# Patient Record
Sex: Female | Born: 1995 | Race: White | Hispanic: No | Marital: Single | Smoking: Never smoker
Health system: Southern US, Community
[De-identification: ages and names within clinical notes are randomized; demographics above are authoritative.]

## PROBLEM LIST (undated history)

## (undated) DIAGNOSIS — R569 Unspecified convulsions: Secondary | ICD-10-CM

## (undated) DIAGNOSIS — F909 Attention-deficit hyperactivity disorder, unspecified type: Secondary | ICD-10-CM

## (undated) DIAGNOSIS — M222X9 Patellofemoral disorders, unspecified knee: Secondary | ICD-10-CM

## (undated) DIAGNOSIS — G2581 Restless legs syndrome: Secondary | ICD-10-CM

## (undated) HISTORY — DX: Patellofemoral disorders, unspecified knee: M22.2X9

## (undated) HISTORY — DX: Unspecified convulsions: R56.9

## (undated) HISTORY — PX: UPPER GI ENDOSCOPY: SHX6162

## (undated) HISTORY — DX: Restless legs syndrome: G25.81

## (undated) HISTORY — PX: WISDOM TOOTH EXTRACTION: SHX21

---
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
## 2018-06-10 ENCOUNTER — Ambulatory Visit (INDEPENDENT_AMBULATORY_CARE_PROVIDER_SITE_OTHER): Payer: Medicaid Other | Admitting: Women's Health

## 2018-06-10 ENCOUNTER — Encounter: Payer: Self-pay | Admitting: Women's Health

## 2018-06-10 VITALS — BP 124/83 | HR 84 | Ht 64.0 in | Wt 200.0 lb

## 2018-06-10 DIAGNOSIS — N930 Postcoital and contact bleeding: Secondary | ICD-10-CM

## 2018-06-10 DIAGNOSIS — Z8742 Personal history of other diseases of the female genital tract: Secondary | ICD-10-CM

## 2018-06-10 DIAGNOSIS — Z319 Encounter for procreative management, unspecified: Secondary | ICD-10-CM

## 2018-06-10 DIAGNOSIS — N912 Amenorrhea, unspecified: Secondary | ICD-10-CM

## 2018-06-10 DIAGNOSIS — Z3202 Encounter for pregnancy test, result negative: Secondary | ICD-10-CM | POA: Diagnosis not present

## 2018-06-10 LAB — POCT URINE PREGNANCY: Preg Test, Ur: NEGATIVE

## 2018-06-10 MED ORDER — MEDROXYPROGESTERONE ACETATE 10 MG PO TABS: 10.0000 mg | ORAL_TABLET | Freq: Every day | ORAL | 0 refills | Status: DC

## 2018-06-10 NOTE — Progress Notes (Signed)
GYN VISIT Patient name: Bianca Singleton MRN [id number]  Date of birth: Feb 05, 1996 Chief Complaint:   Referral (Amenorrhea/ spotting after inercourse)  History of Present Illness:   Bianca Singleton is a 23 y.o. G93 Caucasian female being seen today for report of spotting after sex x couple of weeks, and no period since Sept.   Menarche @ 10-11yo, regular until Sept, hasn't had period since. Prior to this periods lasted 7-9d, heavy entire time, changed ultra tampon q 4mins, small clots, +cramping. Trying to get pregnant since Sept. Multiple +HPTs, neg BHCG and normal TSH w/ PCP. Reports her mom never had +UPT or HCG w/ her pregnancies, only was she knew was w/ u/s.   Patient's last menstrual period was 02/05/2018. The current method of family planning is none. Last pap <21yo. Results were:  normal Review of Systems:   Pertinent items are noted in HPI Denies fever/chills, dizziness, headaches, visual disturbances, fatigue, shortness of breath, chest pain, abdominal pain, vomiting, abnormal vaginal discharge/itching/odor/irritation, problems with periods, bowel movements, urination, or intercourse unless otherwise stated above.  Pertinent History Reviewed:  Reviewed past medical,surgical, social, obstetrical and family history.  Reviewed problem list, medications and allergies. Physical Assessment:   Vitals:   06/10/18 1433  BP: 124/83  Pulse: 84  Weight: 200 lb (90.7 kg)  Height: 5\' 4"  (1.626 m)  Body mass index is 34.33 kg/m.       Physical Examination:   General appearance: alert, well appearing, and in no distress  Mental status: alert, oriented to person, place, and time  Skin: warm & dry   Cardiovascular: normal heart rate noted  Respiratory: normal respiratory effort, no distress  Abdomen: soft, non-tender   Pelvic: VULVA: normal appearing vulva with no masses, tenderness or lesions, VAGINA: normal appearing vagina with normal color and discharge, small amt stringy bloody d/c, no  lesions, CERVIX: normal appearing cervix without discharge or lesions  Extremities: no edema   Informal transabdominal u/s: bladder visualized, then another anechoic large circular area noted- unable to tell if GS or ovarian cyst Informal transvaginal u/s: normal appearing empty uterus w/ what appears to be ovarian cyst  Results for orders placed or performed in visit on 06/10/18 (from the past 24 hour(s))  POCT urine pregnancy   Collection Time: 06/10/18  2:57 PM  Result Value Ref Range   Preg Test, Ur Negative Negative    Assessment & Plan:  1) Secondary amenorrhea> no period since Sept, will get BHCG today, if neg, start provera x10d to see if has w/drawal bleed, if so wishes to check progesterone on day 21 to determine if ovulating  2) H/o menorrhagia  3) Ovarian cyst> will bring back for formal u/s  4) Desire for pregnancy>continue pnv  5) Postcoital spotting> nuswab plus sent  Meds:  Meds ordered this encounter  Medications  . medroxyPROGESTERone (PROVERA) 10 MG tablet    Sig: Take 1 tablet (10 mg total) by mouth daily.    Dispense:  10 tablet    Refill:  0    Order Specific Question:   Supervising Provider    Answer:   Tania Ade H [[id number]]    Orders Placed This Encounter  Procedures  . US PELVIS (TRANSABDOMINAL ONLY)  . US PELVIS TRANSVANGINAL NON-OB (TV ONLY)  . Beta hCG quant (ref lab)  . NuSwab Vaginitis Plus (VG+)  . POCT urine pregnancy    Return in about 2 weeks (around 06/24/2018) for US:GYN and f/u w/ me after.  Roma Schanz  CNM, WHNP-BC 06/10/2018 5:07 PM

## 2018-06-10 NOTE — Patient Instructions (Signed)
Continue prenatal vitamin daily

## 2018-06-11 ENCOUNTER — Encounter: Payer: Self-pay | Admitting: Women's Health

## 2018-06-11 LAB — BETA HCG QUANT (REF LAB): hCG Quant: <1 m[IU]/mL

## 2018-06-14 LAB — NUSWAB VAGINITIS PLUS (VG+)
Candida albicans, NAA: NEGATIVE
Candida glabrata, NAA: NEGATIVE
Chlamydia trachomatis, NAA: NEGATIVE
NEISSERIA GONORRHOEAE, NAA: NEGATIVE
TRICH VAG BY NAA: POSITIVE — AB

## 2018-06-15 ENCOUNTER — Encounter: Payer: Self-pay | Admitting: Women's Health

## 2018-06-15 ENCOUNTER — Other Ambulatory Visit: Payer: Self-pay | Admitting: Women's Health

## 2018-06-15 DIAGNOSIS — A599 Trichomoniasis, unspecified: Secondary | ICD-10-CM | POA: Insufficient documentation

## 2018-06-15 MED ORDER — METRONIDAZOLE 500 MG PO TABS: 500.0000 mg | ORAL_TABLET | Freq: Two times a day (BID) | ORAL | 0 refills | Status: DC

## 2018-06-24 ENCOUNTER — Other Ambulatory Visit: Payer: Self-pay

## 2018-06-24 ENCOUNTER — Ambulatory Visit (INDEPENDENT_AMBULATORY_CARE_PROVIDER_SITE_OTHER): Payer: Medicaid Other

## 2018-06-24 ENCOUNTER — Ambulatory Visit (INDEPENDENT_AMBULATORY_CARE_PROVIDER_SITE_OTHER): Payer: Medicaid Other | Admitting: Women's Health

## 2018-06-24 ENCOUNTER — Encounter: Payer: Self-pay | Admitting: Women's Health

## 2018-06-24 ENCOUNTER — Other Ambulatory Visit: Payer: Self-pay | Admitting: Women's Health

## 2018-06-24 VITALS — BP 110/50 | HR 69 | Ht 64.0 in | Wt 205.0 lb

## 2018-06-24 DIAGNOSIS — N912 Amenorrhea, unspecified: Secondary | ICD-10-CM

## 2018-06-24 DIAGNOSIS — N83202 Unspecified ovarian cyst, left side: Secondary | ICD-10-CM | POA: Diagnosis not present

## 2018-06-24 DIAGNOSIS — Z8742 Personal history of other diseases of the female genital tract: Secondary | ICD-10-CM | POA: Diagnosis not present

## 2018-06-24 DIAGNOSIS — N83201 Unspecified ovarian cyst, right side: Secondary | ICD-10-CM | POA: Diagnosis not present

## 2018-06-24 DIAGNOSIS — N930 Postcoital and contact bleeding: Secondary | ICD-10-CM

## 2018-06-24 MED ORDER — NORGESTIMATE-ETH ESTRADIOL 0.25-35 MG-MCG PO TABS: 1.0000 | ORAL_TABLET | Freq: Every day | ORAL | 0 refills | Status: DC

## 2018-06-24 NOTE — Progress Notes (Signed)
GYN VISIT Patient name: Bianca Singleton MRN [id number]  Date of birth: 05/19/1996 Chief Complaint:   Follow-up (gyn u/s)  History of Present Illness:   Bianca Singleton is a 23 y.o. G84 Caucasian female being seen today for f/u after pelvic u/s done for ovarian cyst/abdominal pain. Having constant RLQ sharp crampy pain. Treated 1/27 for trichomonas, her & partner took all meds, no sex since. Secondary amenorrhea- last period 9/19. She was given 10d provera challenge 1/22-1/31, has not started bleeding. Just 'wants pain gone'.     Does not smoke, no Singleton/o HTN (no recent hx), DVT/PE, CVA, MI, or migraines w/ aura.  No LMP recorded. The current method of family planning is none. Last pap <21yo. Results were:  n/a Review of Systems:   Pertinent items are noted in HPI Denies fever/chills, dizziness, headaches, visual disturbances, fatigue, shortness of breath, chest pain, abdominal pain, vomiting, abnormal vaginal discharge/itching/odor/irritation, problems with periods, bowel movements, urination, or intercourse unless otherwise stated above.  Pertinent History Reviewed:  Reviewed past medical,surgical, social, obstetrical and family history.  Reviewed problem list, medications and allergies. Physical Assessment:   Vitals:   06/24/18 1514  BP: (!) 110/50  Pulse: 69  Weight: 205 lb (93 kg)  Height: 5\' 4"  (1.626 m)  Body mass index is 35.19 kg/m.       Physical Examination:   General appearance: alert, well appearing, and in no distress  Mental status: alert, oriented to person, place, and time  Skin: warm & dry   Cardiovascular: normal heart rate noted  Respiratory: normal respiratory effort, no distress  Abdomen: soft, non-tender   Pelvic: examination not indicated  Extremities: no edema    Today's Pelvic U/S:  Kanyon Bunn is a 23 y.o. LMP 02/05/2018,she is here for a pelvic sonogram for pelvic pain,amenorrhea.  Uterus                      7.4 x 4 x 4.8 cm, Total uterine volume 75  cc,homogeneous anteverted uterus,wnl  Endometrium          5.9 mm, symmetrical, wnl  Right ovary             4.7 x 6.7 x 4.8 cm, two simple right ovarian cysts (#1) 4.5 x 3.4 x 4.1 cm,(#2) 4.3 x 4.2 x 2.6 cm  Left ovary                5.7 x 4.7 x 3 cm, two simple left ovarian cysts (#1) 2.9 x 2 x 3.4 cm,(#2) 2.6 x 2.1 x 2.2 cm  No free fluid   Technician Comments:  PELVIC US TA/TV:homogeneous anteverted uterus,wnl,EEC 5.9 mm,bilat simple ovarian cysts w/arterial and venous flow,two simple left ovarian cysts (#1) 2.9 x 2 x 3.4 cm,(#2) 2.6 x 2.1 x 2.2 cm,two simple right ovarian cysts (#1) 4.5 x 3.4 x 4.1 cm,(#2) 4.3 x 4.2 x 2.6 cm,no free fluid,bilat adnexal pain,ovaries appear mobile  [employer] 06/24/2018 3:21 PM  No results found for this or any previous visit (from the past 24 hour(s)).  Assessment & Plan:  1) Bilateral ovarian cysts w/ RLQ pain and secondary amenorrhea> 2 on q side, no bleeding so far 5d s/p provera challenge, reviewed w/ Bianca Singleton, recommends Sprintec x 49mth, then f/u  2) Recent trichomonas> too early for POC today, will do next visit  Meds:  Meds ordered this encounter  Medications  . norgestimate-ethinyl estradiol (ORTHO-CYCLEN,SPRINTEC,PREVIFEM) 0.25-35 MG-MCG tablet    Sig: Take 1  tablet by mouth daily.    Dispense:  1 Package    Refill:  0    Order Specific Question:   Supervising Provider    Answer:   Elonda Singleton, Bianca Singleton [[id number]]    No orders of the defined types were placed in this encounter.   Return in about 1 month (around 07/23/2018) for F/U w/ MD.  Bianca Singleton CNM, WHNP-BC 06/24/2018 3:50 PM

## 2018-06-24 NOTE — Progress Notes (Signed)
PELVIC US TA/TV:homogeneous anteverted uterus,wnl,EEC 5.9 mm,bilat simple ovarian cysts w/arterial and venous flow,two simple left ovarian cysts (#1) 2.9 x 2 x 3.4 cm,(#2) 2.6 x 2.1 x 2.2 cm,two simple right ovarian cysts (#1) 4.5 x 3.4 x 4.1 cm,(#2) 4.3 x 4.2 x 2.6 cm,no free fluid,bilat adnexal pain,ovaries appear mobile

## 2018-06-26 ENCOUNTER — Other Ambulatory Visit: Payer: Self-pay | Admitting: Adult Health

## 2018-06-26 MED ORDER — PROMETHAZINE HCL 25 MG PO TABS: 25.0000 mg | ORAL_TABLET | Freq: Four times a day (QID) | ORAL | 1 refills | Status: DC | PRN

## 2018-06-26 NOTE — Progress Notes (Signed)
rx phenergan 

## 2018-06-30 NOTE — Telephone Encounter (Signed)
Return call to patient to find out about her current pain levels.  She is taken some ibuprofen and describes this is "not helping.  She sounds pretty calm and relaxed.  She is also tried tramadol and describes it is "not helping".  I am not sure what her expectations are in terms of pain relief. Patient advised to try to take that tramadol she received in the emergency room and the ibuprofen in combination to see if that helps and to try to give the oral contraceptive regimen some more time to suppress the ovaries.  If this does not work she is advised to call back and will make an appointment to see what the next treatment is required.,  Whether that be laparoscopy or other treatments

## 2018-07-01 ENCOUNTER — Ambulatory Visit (INDEPENDENT_AMBULATORY_CARE_PROVIDER_SITE_OTHER): Payer: Medicaid Other | Admitting: Obstetrics and Gynecology

## 2018-07-01 ENCOUNTER — Encounter: Payer: Self-pay | Admitting: Obstetrics and Gynecology

## 2018-07-01 VITALS — BP 114/71 | HR 82 | Ht 64.0 in | Wt 207.0 lb

## 2018-07-01 DIAGNOSIS — R1084 Generalized abdominal pain: Secondary | ICD-10-CM

## 2018-07-01 DIAGNOSIS — Z8619 Personal history of other infectious and parasitic diseases: Secondary | ICD-10-CM | POA: Diagnosis not present

## 2018-07-01 DIAGNOSIS — Z09 Encounter for follow-up examination after completed treatment for conditions other than malignant neoplasm: Secondary | ICD-10-CM

## 2018-07-01 NOTE — Progress Notes (Signed)
Patient ID: Bianca Singleton, female   DOB: December 28, 1995, 23 y.o.   MRN: [id number]    [facility] Visit  @DATE @            Patient name: Bianca Singleton MRN [id number]  Date of birth: 1996-01-08  CC & HPI:  [name] is a 23 y.o. female presenting today for" bilateral ovarian cyst pain" Says that pain is 7 or 8 mainly on right side of navel. Has had upper endoscopy but no surgeries.  Pain has been going on since November 2019. Pain comes and goes every 5-10 minutes, has not found anything that makes anything better. Has more pain for about 10-15 minutes after bowel movements. Has bowel movement twice a day with shape., formed stools. Has NOT had bm today.  Pulse is 60 at time of perceived pain rated a 7., with no guarding or rebound. Pt has nonverbal boyfriend with her, and told RN before eval not to mention the Trich infection to boyfriend cause he don't want to hear of it., and so POC done without discussion. ROS:  ROS +abdominal pain +bilateral ovarian cysts -fever -chills  All systems are negative except as noted in the HPI and PMH.    Pertinent History Reviewed:   Reviewed: Medical         Past Medical History:  Diagnosis Date  . ADHD (attention deficit hyperactivity disorder)   . Patella-femoral syndrome   . Restless legs                               Surgical Hx:    Past Surgical History:  Procedure Laterality Date  . UPPER GI ENDOSCOPY    . WISDOM TOOTH EXTRACTION     Medications: Reviewed & Updated - see associated section                       Current Outpatient Medications:  .  meloxicam (MOBIC) 15 MG tablet, TK 1 T PO QD, Disp: , Rfl:  .  norgestimate-ethinyl estradiol (ORTHO-CYCLEN,SPRINTEC,PREVIFEM) 0.25-35 MG-MCG tablet, Take 1 tablet by mouth daily., Disp: 1 Package, Rfl: 0 .  pramipexole (MIRAPEX) 0.5 MG tablet, Take 0.5 mg by mouth 3 (three) times daily., Disp: , Rfl:  .  promethazine (PHENERGAN) 25 MG tablet, Take 1 tablet (25 mg total) by mouth every  6 (six) hours as needed for nausea or vomiting., Disp: 30 tablet, Rfl: 1 .  traZODone (DESYREL) 50 MG tablet, TAKE 1-2 TABLETS BY MOUTH EVERY DAY AT BEDTIME AS NEEDED FOR SLEEP, Disp: , Rfl:  .  medroxyPROGESTERone (PROVERA) 10 MG tablet, Take 1 tablet (10 mg total) by mouth daily. (Patient not taking: Reported on 07/01/2018), Disp: 10 tablet, Rfl: 0   Social History: Reviewed -  reports that she has never smoked. She has never used smokeless tobacco.  Objective Findings:  Vitals: Blood pressure 114/71, pulse 82, height 5\' 4"  (1.626 m), weight 207 lb (93.9 kg).  PHYSICAL EXAMINATION General appearance - alert, well appearing, and in no distress Mental status - alert, oriented to person, place, and time, normal mood, behavior, speech, dress, motor activity, and thought processes, affect appropriate to mood Abdomen- soft, hurts when pressing inwards on stomach, no guarding or rebound  PELVIC Bowel: hard, well formed bowel Vagina - generous amount white creamy normal in appereance Cervix - healthy secretions Uterus - normal Wet Mount - nml epithelial, neg trich  Assessment & Plan:  A:  1. Functional GI pain 2. overweight 3. Trichomonas POC negative 4. Ovarian cysts by history, NOT the source of pt current concerns  P:  1. Miralax dalily 2. F/u in 2 weeks for re-eval.    By signing my name below, I, [name], attest that this documentation has been prepared under the direction and in the presence of Jonnie Kind, MD. Electronically Signed: [place]. 07/01/18. 2:45 PM.  I personally performed the services described in this documentation, which was SCRIBED in my presence. The recorded information has been reviewed and considered accurate. It has been edited as necessary during review. Jonnie Kind, MD

## 2018-07-21 ENCOUNTER — Other Ambulatory Visit: Payer: Self-pay | Admitting: *Deleted

## 2018-07-21 MED ORDER — NORGESTIMATE-ETH ESTRADIOL 0.25-35 MG-MCG PO TABS: 1.0000 | ORAL_TABLET | Freq: Every day | ORAL | 11 refills | Status: AC

## 2018-07-22 ENCOUNTER — Ambulatory Visit: Payer: Self-pay | Admitting: Obstetrics and Gynecology

## 2018-07-28 ENCOUNTER — Telehealth: Payer: Self-pay | Admitting: Obstetrics and Gynecology

## 2018-07-28 NOTE — Telephone Encounter (Signed)
Phone call to Lamar this afternoon at 1:30 PM: Jordon reports that she attempted stool softeners after last visit "they did not help even though they did make her bowel movements softer.  She is not currently taking stool softener if she is advised to resume taking it Patrica reports that the pain is worse when she is up and about but it is intermittent pain inside a continuous steady pain.  I have reviewed the ultrasound from February and she had 3 simple cyst, 2 of them on the right with no swelling in the ovaries and no suspicion of torsion. Morgan remains on her birth control pill Sprintec which is an intermediate dose estrogen containing pill which should be able to suppress the ovaries. 4.  Plan patient is asked to keep a pain calendar, continue the stool softener and birth control pills and plan to follow-up as scheduled on 23rd August 07, 2018.  Patient aware that she can try to move her appointment up if the pain worsens

## 2018-08-10 ENCOUNTER — Telehealth: Payer: Self-pay | Admitting: Obstetrics and Gynecology

## 2018-08-10 ENCOUNTER — Other Ambulatory Visit: Payer: Self-pay

## 2018-08-10 ENCOUNTER — Encounter: Payer: Self-pay | Admitting: Obstetrics and Gynecology

## 2018-08-10 ENCOUNTER — Ambulatory Visit (INDEPENDENT_AMBULATORY_CARE_PROVIDER_SITE_OTHER): Payer: Medicaid Other | Admitting: Obstetrics and Gynecology

## 2018-08-10 VITALS — BP 100/70 | HR 69 | Ht 64.0 in | Wt 210.0 lb

## 2018-08-10 DIAGNOSIS — R1084 Generalized abdominal pain: Secondary | ICD-10-CM | POA: Diagnosis not present

## 2018-08-10 DIAGNOSIS — N760 Acute vaginitis: Secondary | ICD-10-CM

## 2018-08-10 DIAGNOSIS — B9689 Other specified bacterial agents as the cause of diseases classified elsewhere: Secondary | ICD-10-CM

## 2018-08-10 LAB — POCT WET PREP (WET MOUNT): Trichomonas Wet Prep HPF POC: ABSENT

## 2018-08-10 MED ORDER — METRONIDAZOLE 500 MG PO TABS: 500.0000 mg | ORAL_TABLET | Freq: Two times a day (BID) | ORAL | 0 refills | Status: AC

## 2018-08-10 NOTE — Progress Notes (Signed)
Patient ID: Bianca Singleton, female   DOB: 1995/11/29, 23 y.o.   MRN: [id number]   [facility] Visit  @DATE @            Patient name: Bianca Singleton MRN [id number]  Date of birth: 04-02-1996  CC & HPI:  Bianca Singleton is a 23 y.o. female presenting today for POC trich, f/u amenorrhea & cysts. Still having right sided LQ pain towards her hip. She notices the pain whenever she is up walking around. Is still taking stool softener with regular BM 3-4 times a day. Says bf has been treated for trich infection. Denies noticing vaginal discharge. Partner was treated. LMP 07/30/2018  ROS:  ROS +RLQ pain -fever  All systems are negative except as noted in the HPI and PMH.   Pertinent History Reviewed:   Reviewed:  Medical         Past Medical History:  Diagnosis Date  . ADHD (attention deficit hyperactivity disorder)   . Patella-femoral syndrome   . Restless legs                               Surgical Hx:    Past Surgical History:  Procedure Laterality Date  . UPPER GI ENDOSCOPY    . WISDOM TOOTH EXTRACTION     Medications: Reviewed & Updated - see associated section                       Current Outpatient Medications:  .  medroxyPROGESTERone (PROVERA) 10 MG tablet, Take 1 tablet (10 mg total) by mouth daily. (Patient not taking: Reported on 07/01/2018), Disp: 10 tablet, Rfl: 0 .  meloxicam (MOBIC) 15 MG tablet, TK 1 T PO QD, Disp: , Rfl:  .  norgestimate-ethinyl estradiol (ORTHO-CYCLEN,SPRINTEC,PREVIFEM) 0.25-35 MG-MCG tablet, Take 1 tablet by mouth daily., Disp: 1 Package, Rfl: 11 .  pramipexole (MIRAPEX) 0.5 MG tablet, Take 0.5 mg by mouth 3 (three) times daily., Disp: , Rfl:  .  promethazine (PHENERGAN) 25 MG tablet, Take 1 tablet (25 mg total) by mouth every 6 (six) hours as needed for nausea or vomiting., Disp: 30 tablet, Rfl: 1 .  traZODone (DESYREL) 50 MG tablet, TAKE 1-2 TABLETS BY MOUTH EVERY DAY AT BEDTIME AS NEEDED FOR SLEEP, Disp: , Rfl:    Social History: Reviewed -   reports that she has never smoked. She has never used smokeless tobacco.  Objective Findings:  Vitals: There were no vitals taken for this visit.  PHYSICAL EXAMINATION General appearance - alert, well appearing, and in no distress Mental status - normal mood, behavior, speech, dress, motor activity, and thought processes, affect appropriate to mood  PELVIC Vagina - normal Cervix - appears normal Uterus - normal Wet Mount - POC trich, pos clue   Assessment & Plan:   A:  1. POC Trich 2. BV  P:  1.  Repeat TV u/s in 3 weeks with f/u by phone 2. Metrogel BID x7 days     By signing my name below, I, [name], attest that this documentation has been prepared under the direction and in the presence of Jonnie Kind, MD. Electronically Signed: [place]. 08/10/18. 3:34 PM.  I personally performed the services described in this documentation, which was SCRIBED in my presence. The recorded information has been reviewed and considered accurate. It has been edited as necessary during review. Jonnie Kind, MD

## 2018-08-10 NOTE — Addendum Note (Signed)
Addended by: Linton Rump on: 08/10/2018 05:10 PM   Modules accepted: Orders

## 2018-08-10 NOTE — Telephone Encounter (Signed)
Telephone call to attempt to Telehealth today's appt. Pt unavailable.

## 2018-08-15 ENCOUNTER — Encounter: Payer: Self-pay | Admitting: Emergency Medicine

## 2018-08-15 ENCOUNTER — Emergency Department
Admission: EM | Admit: 2018-08-15 | Discharge: 2018-08-15 | Disposition: A | Discharge status: Home / Self Care | Payer: Medicaid Other | Attending: Student in an Organized Health Care Education/Training Program | Admitting: Student in an Organized Health Care Education/Training Program

## 2018-08-15 ENCOUNTER — Emergency Department: Payer: Medicaid Other

## 2018-08-15 ENCOUNTER — Other Ambulatory Visit: Payer: Self-pay

## 2018-08-15 DIAGNOSIS — Z79899 Other long term (current) drug therapy: Secondary | ICD-10-CM | POA: Insufficient documentation

## 2018-08-15 DIAGNOSIS — R197 Diarrhea, unspecified: Secondary | ICD-10-CM | POA: Diagnosis not present

## 2018-08-15 DIAGNOSIS — R112 Nausea with vomiting, unspecified: Secondary | ICD-10-CM | POA: Insufficient documentation

## 2018-08-15 DIAGNOSIS — R1011 Right upper quadrant pain: Secondary | ICD-10-CM | POA: Diagnosis present

## 2018-08-15 DIAGNOSIS — F909 Attention-deficit hyperactivity disorder, unspecified type: Secondary | ICD-10-CM | POA: Insufficient documentation

## 2018-08-15 DIAGNOSIS — R1013 Epigastric pain: Secondary | ICD-10-CM | POA: Insufficient documentation

## 2018-08-15 LAB — URINALYSIS, COMPLETE (UACMP) WITH MICROSCOPIC
BILIRUBIN URINE: NEGATIVE
Glucose, UA: NEGATIVE mg/dL
KETONES UR: NEGATIVE mg/dL
Nitrite: NEGATIVE
PH: 5 (ref 5.0–8.0)
Protein, ur: NEGATIVE mg/dL
Specific Gravity, Urine: 1.019 (ref 1.005–1.030)

## 2018-08-15 LAB — POCT PREGNANCY, URINE: Preg Test, Ur: NEGATIVE

## 2018-08-15 LAB — COMPREHENSIVE METABOLIC PANEL
ALT: 16 U/L (ref 0–44)
ANION GAP: 7 (ref 5–15)
AST: 17 U/L (ref 15–41)
Albumin: 4.2 g/dL (ref 3.5–5.0)
Alkaline Phosphatase: 47 U/L (ref 38–126)
BUN: 20 mg/dL (ref 6–20)
CHLORIDE: 106 mmol/L (ref 98–111)
CO2: 26 mmol/L (ref 22–32)
Calcium: 8.9 mg/dL (ref 8.9–10.3)
Creatinine, Ser: 0.78 mg/dL (ref 0.44–1.00)
GFR calc Af Amer: >60 mL/min (ref >60)
GFR calc non Af Amer: >60 mL/min (ref >60)
Glucose, Bld: 109 mg/dL — ABNORMAL HIGH (ref 70–99)
POTASSIUM: 3.4 mmol/L — AB (ref 3.5–5.1)
SODIUM: 139 mmol/L (ref 135–145)
TOTAL PROTEIN: 7.2 g/dL (ref 6.5–8.1)
Total Bilirubin: 0.7 mg/dL (ref 0.3–1.2)

## 2018-08-15 LAB — CBC WITH DIFFERENTIAL/PLATELET
Abs Immature Granulocytes: 0.05 10*3/uL (ref 0.00–0.07)
BASOS ABS: 0.1 10*3/uL (ref 0.0–0.1)
Basophils Relative: 1 %
EOS ABS: 0.3 10*3/uL (ref 0.0–0.5)
EOS PCT: 2 %
HCT: 39 % (ref 36.0–46.0)
Hemoglobin: 12.9 g/dL (ref 12.0–15.0)
Immature Granulocytes: 1 %
Lymphocytes Relative: 37 %
Lymphs Abs: 4 10*3/uL (ref 0.7–4.0)
MCH: 29.2 pg (ref 26.0–34.0)
MCHC: 33.1 g/dL (ref 30.0–36.0)
MCV: 88.2 fL (ref 80.0–100.0)
MONO ABS: 0.5 10*3/uL (ref 0.1–1.0)
Monocytes Relative: 5 %
NEUTROS ABS: 5.9 10*3/uL (ref 1.7–7.7)
Neutrophils Relative %: 54 %
Platelets: 264 10*3/uL (ref 150–400)
RBC: 4.42 MIL/uL (ref 3.87–5.11)
RDW: 13.3 % (ref 11.5–15.5)
WBC: 10.8 10*3/uL — AB (ref 4.0–10.5)
nRBC: 0 % (ref 0.0–0.2)

## 2018-08-15 LAB — LIPASE, BLOOD: Lipase: 21 U/L (ref 11–51)

## 2018-08-15 MED ORDER — SODIUM CHLORIDE 0.9 % IV BOLUS
1000.0000 mL | Freq: Once | INTRAVENOUS | Status: AC
  Administered 2018-08-15: 1000 mL via INTRAVENOUS

## 2018-08-15 MED ORDER — ONDANSETRON HCL 4 MG/2ML IJ SOLN
4.0000 mg | Freq: Once | INTRAMUSCULAR | Status: AC
  Administered 2018-08-15: 4 mg via INTRAVENOUS
  Filled 2018-08-15: qty 2

## 2018-08-15 MED ORDER — PROMETHAZINE HCL 25 MG PO TABS: 25.0000 mg | ORAL_TABLET | Freq: Four times a day (QID) | ORAL | 0 refills | Status: DC | PRN

## 2018-08-15 NOTE — Discharge Instructions (Signed)

## 2018-08-15 NOTE — ED Triage Notes (Addendum)
Pt was seen here earlier this am for abd pain and diarrhea; pt says when she was discharged she was told to return if the symptoms returned; pt says she's having diarrhea again but none of her symptoms are worse than when she was here earlier; pt in no acute distress

## 2018-08-15 NOTE — ED Notes (Signed)
Pt reports no nausea at this time

## 2018-08-15 NOTE — ED Provider Notes (Signed)
[facility] Emergency Department Provider Note    First MD Initiated Contact with Patient 08/15/18 [phone]     (approximate)  I have reviewed the triage vital signs and the nursing notes.   HISTORY  Chief Complaint Abdominal Pain; Fever; and Shortness of Breath    HPI Bianca Singleton is a 23 y.o. female below listed past medical history presents the ER for evaluation of generalized abdominal pain fever to 102 yesterday with crampy abdominal pain diarrhea.  Is on erythromycin for strep throat.  Does have a history of ovarian cyst.  Denies any vaginal discharge or dysuria.  Denies any chance of being pregnant.  No known sick contacts.   Past Medical History:  Diagnosis Date  . ADHD (attention deficit hyperactivity disorder)   . Patella-femoral syndrome   . Restless legs    Family History  Problem Relation Age of Onset  . Lung cancer Paternal Grandfather   . Breast cancer Paternal Grandmother    Past Surgical History:  Procedure Laterality Date  . UPPER GI ENDOSCOPY    . WISDOM TOOTH EXTRACTION     Patient Active Problem List   Diagnosis Date Noted  . Intermittent generalized abdominal pain 07/01/2018  . Trichomonas infection 06/15/2018      Prior to Admission medications   Medication Sig Start Date End Date Taking? Authorizing Provider  meloxicam (MOBIC) 15 MG tablet TK 1 T PO QD 06/07/18   [provider]  metroNIDAZOLE (FLAGYL) 500 MG tablet Take 1 tablet (500 mg total) by mouth 2 (two) times daily for 7 days. 08/10/18 08/17/18  Jonnie Kind, MD  norgestimate-ethinyl estradiol (ORTHO-CYCLEN,SPRINTEC,PREVIFEM) 0.25-35 MG-MCG tablet Take 1 tablet by mouth daily. 07/21/18   Roma Schanz, CNM  pramipexole (MIRAPEX) 0.5 MG tablet Take 0.5 mg by mouth 3 (three) times daily.    [provider]  promethazine (PHENERGAN) 25 MG tablet Take 1 tablet (25 mg total) by mouth every 6 (six) hours as needed for nausea or vomiting. 08/15/18    Merlyn Lot, MD  traZODone (DESYREL) 50 MG tablet 25 mg at bedtime.  03/12/18   [provider]    Allergies Patient has no known allergies.    Social History Social History   Tobacco Use  . Smoking status: Never Smoker  . Smokeless tobacco: Never Used  Substance Use Topics  . Alcohol use: Never    Frequency: Never  . Drug use: Never    Review of Systems Patient denies headaches, rhinorrhea, blurry vision, numbness, shortness of breath, chest pain, edema, cough, abdominal pain, nausea, vomiting, diarrhea, dysuria, fevers, rashes or hallucinations unless otherwise stated above in HPI. ____________________________________________   PHYSICAL EXAM:  VITAL SIGNS: Vitals:   08/15/18 0443  BP: 119/67  Pulse: 86  Resp: 16  Temp: 97.6 F (36.4 C)  SpO2: 100%    Constitutional: Alert and oriented.  Eyes: Conjunctivae are normal.  Head: Atraumatic. Nose: No congestion/rhinnorhea. Mouth/Throat: Mucous membranes are moist.   Neck: No stridor. Painless ROM.  Cardiovascular: Normal rate, regular rhythm. Grossly normal heart sounds.  Good peripheral circulation. Respiratory: Normal respiratory effort.  No retractions. Lungs CTAB. Gastrointestinal: obese without focal ttp No distention. No abdominal bruits. No CVA tenderness. Genitourinary: deferred Musculoskeletal: No lower extremity tenderness nor edema.  No joint effusions. Neurologic:  Normal speech and language. No gross focal neurologic deficits are appreciated. No facial droop Skin:  Skin is warm, dry and intact. No rash noted. Psychiatric: Mood and affect are normal. Speech and  behavior are normal.  ____________________________________________   LABS (all labs ordered are listed, but only abnormal results are displayed)  Results for orders placed or performed during the hospital encounter of 08/15/18 (from the past 24 hour(s))  CBC with Differential     Status: Abnormal   Collection Time: 08/15/18   5:09 AM  Result Value Ref Range   WBC 10.8 (H) 4.0 - 10.5 K/uL   RBC 4.42 3.87 - 5.11 MIL/uL   Hemoglobin 12.9 12.0 - 15.0 g/dL   HCT 39.0 36.0 - 46.0 %   MCV 88.2 80.0 - 100.0 fL   MCH 29.2 26.0 - 34.0 pg   MCHC 33.1 30.0 - 36.0 g/dL   RDW 13.3 11.5 - 15.5 %   Platelets 264 150 - 400 K/uL   nRBC 0.0 0.0 - 0.2 %   Neutrophils Relative % 54 %   Neutro Abs 5.9 1.7 - 7.7 K/uL   Lymphocytes Relative 37 %   Lymphs Abs 4.0 0.7 - 4.0 K/uL   Monocytes Relative 5 %   Monocytes Absolute 0.5 0.1 - 1.0 K/uL   Eosinophils Relative 2 %   Eosinophils Absolute 0.3 0.0 - 0.5 K/uL   Basophils Relative 1 %   Basophils Absolute 0.1 0.0 - 0.1 K/uL   Immature Granulocytes 1 %   Abs Immature Granulocytes 0.05 0.00 - 0.07 K/uL  Comprehensive metabolic panel     Status: Abnormal   Collection Time: 08/15/18  5:09 AM  Result Value Ref Range   Sodium 139 135 - 145 mmol/L   Potassium 3.4 (L) 3.5 - 5.1 mmol/L   Chloride 106 98 - 111 mmol/L   CO2 26 22 - 32 mmol/L   Glucose, Bld 109 (H) 70 - 99 mg/dL   BUN 20 6 - 20 mg/dL   Creatinine, Ser 0.78 0.44 - 1.00 mg/dL   Calcium 8.9 8.9 - 10.3 mg/dL   Total Protein 7.2 6.5 - 8.1 g/dL   Albumin 4.2 3.5 - 5.0 g/dL   AST 17 15 - 41 U/L   ALT 16 0 - 44 U/L   Alkaline Phosphatase 47 38 - 126 U/L   Total Bilirubin 0.7 0.3 - 1.2 mg/dL   GFR calc non Af Amer >60 >60 mL/min   GFR calc Af Amer >60 >60 mL/min   Anion gap 7 5 - 15  Lipase, blood     Status: None   Collection Time: 08/15/18  5:09 AM  Result Value Ref Range   Lipase 21 11 - 51 U/L  Pregnancy, urine POC     Status: None   Collection Time: 08/15/18  5:55 AM  Result Value Ref Range   Preg Test, Ur NEGATIVE NEGATIVE  Urinalysis, Complete w Microscopic     Status: Abnormal   Collection Time: 08/15/18  5:58 AM  Result Value Ref Range   Color, Urine YELLOW (A) YELLOW   APPearance CLOUDY (A) CLEAR   Specific Gravity, Urine 1.019 1.005 - 1.030   pH 5.0 5.0 - 8.0   Glucose, UA NEGATIVE NEGATIVE mg/dL    Hgb urine dipstick SMALL (A) NEGATIVE   Bilirubin Urine NEGATIVE NEGATIVE   Ketones, ur NEGATIVE NEGATIVE mg/dL   Protein, ur NEGATIVE NEGATIVE mg/dL   Nitrite NEGATIVE NEGATIVE   Leukocytes,Ua TRACE (A) NEGATIVE   RBC / HPF 6-10 0 - 5 RBC/hpf   WBC, UA 21-50 0 - 5 WBC/hpf   Bacteria, UA FEW (A) NONE SEEN   Squamous Epithelial / LPF 11-20 0 - 5  Mucus PRESENT    ____________________________________________ ____________________________________________  RADIOLOGY  I personally reviewed all radiographic images ordered to evaluate for the above acute complaints and reviewed radiology reports and findings.  These findings were personally discussed with the patient.  Please see medical record for radiology report.  ____________________________________________   PROCEDURES  Procedure(s) performed:  Procedures    Critical Care performed: no ____________________________________________   INITIAL IMPRESSION / ASSESSMENT AND PLAN / ED COURSE  Pertinent labs & imaging results that were available during my care of the patient were reviewed by me and considered in my medical decision making (see chart for details).   DDX: Enteritis, gastritis, colitis, IBD, IBS, appendicitis, cholecystitis, cholelithiasis  Anishka Bushard is a 23 y.o. who presents to the ED with symptoms as described above.  She is afebrile and nontoxic-appearing blood will be sent for the by differential.  Chest x-ray does not show any evidence of infiltrates.  She does not meet criteria for COVID-19 testing at this time per protocol.  Will check blood work provide IV fluids.  She does have some epigastric primarily right upper quadrant tenderness.  Denies any lower abdominal tenderness to suggest appendicitis.  Clinical Course as of Aug 15 707  Sat Aug 15, 2018  0611 LFTs are normal but given the leukocytosis on antibiotics with epigastric and right upper quadrant pain will order ultrasound to exclude biliary  pathology.   [PR]  0708 Patient reassessed.  Repeat abdominal exam soft and benign.  Ultrasound is reassuring.  At this point do believe she stable and appropriate for period of observation as an outpatient.   [PR]    Clinical Course User Index [PR] Merlyn Lot, MD    The patient was evaluated in Emergency Department today for the symptoms described in the history of present illness. He/she was evaluated in the context of the global COVID-19 pandemic, which necessitated consideration that the patient might be at risk for infection with the SARS-CoV-2 virus that causes COVID-19. Institutional protocols and algorithms that pertain to the evaluation of patients at risk for COVID-19 are in a state of rapid change based on information released by regulatory bodies including the CDC and federal and state organizations. These policies and algorithms were followed during the patient's care in the ED.  As part of my medical decision making, I reviewed the following data within the [place] notes reviewed and incorporated, Labs reviewed, notes from prior ED visits.  ____________________________________________   FINAL CLINICAL IMPRESSION(S) / ED DIAGNOSES  Final diagnoses:  Epigastric pain  Diarrhea, unspecified type      NEW MEDICATIONS STARTED DURING THIS VISIT:  Current Discharge Medication List       Note:  This document was prepared using Dragon voice recognition software and may include unintentional dictation errors.    Merlyn Lot, MD 08/15/18 [phone]

## 2018-08-15 NOTE — ED Notes (Signed)
IV attempt x 2 by this RN. 

## 2018-08-15 NOTE — ED Triage Notes (Signed)
Pt to triage via wheelchair. Pt report DX'd with strep on Thursday 3/26. No having ABD pain, N/V/D, shob, rash and just not feeling any better.

## 2018-08-15 NOTE — ED Notes (Signed)
US tech at bedside

## 2018-08-16 ENCOUNTER — Emergency Department
Admission: EM | Admit: 2018-08-16 | Discharge: 2018-08-16 | Disposition: A | Discharge status: Home / Self Care | Payer: Medicaid Other | Source: Home / Self Care | Attending: Emergency Medicine | Admitting: Emergency Medicine

## 2018-08-16 DIAGNOSIS — R112 Nausea with vomiting, unspecified: Secondary | ICD-10-CM

## 2018-08-16 DIAGNOSIS — R197 Diarrhea, unspecified: Principal | ICD-10-CM

## 2018-08-16 MED ORDER — DICYCLOMINE HCL 10 MG/5ML PO SOLN
10.0000 mg | Freq: Once | ORAL | Status: AC
  Administered 2018-08-16: 10 mg via ORAL
  Filled 2018-08-16 (×2): qty 5

## 2018-08-16 MED ORDER — ALUM & MAG HYDROXIDE-SIMETH 200-200-20 MG/5ML PO SUSP
30.0000 mL | Freq: Once | ORAL | Status: AC
  Administered 2018-08-16: 30 mL via ORAL
  Filled 2018-08-16: qty 30

## 2018-08-16 MED ORDER — LIDOCAINE VISCOUS HCL 2 % MT SOLN
15.0000 mL | Freq: Once | OROMUCOSAL | Status: AC
  Administered 2018-08-16: 15 mL via ORAL
  Filled 2018-08-16: qty 15

## 2018-08-16 MED ORDER — ONDANSETRON 4 MG PO TBDP
4.0000 mg | ORAL_TABLET | Freq: Once | ORAL | Status: AC
  Administered 2018-08-16: 4 mg via ORAL
  Filled 2018-08-16: qty 1

## 2018-08-16 NOTE — ED Notes (Signed)
Assessment: pt states she is worried she has COVID-19. Pt states she has been vomting, having sore throat, abdominal pain, left sided chest pain, diarrhea for 3 days. Pt appears in no acute distress. Pt states she has had a possible fever. Pt does not have a cough. Pt states she was at the park today with multiple people when she became weak and vomited. Pt educated on process of self isolating at home if she is worried she has COVID-19 and how to follow the CDC guidelines regarding self isolation. Pt was diagnosed with strep throat 3 days pta. Pt states she was here last pm in emergency department and evaluated for her abd pain.

## 2018-08-16 NOTE — ED Provider Notes (Signed)
[facility] Emergency Department Provider Note   ____________________________________________   First MD Initiated Contact with Patient 08/16/18 0130     (approximate)  I have reviewed the triage vital signs and the nursing notes.   HISTORY  Chief Complaint Abdominal Pain and Diarrhea   HPI Bianca Singleton is a 23 y.o. female patient was seen yesterday for abdominal pain and diarrhea.  She had normal lab work showed an ultrasound was normal comes back today because of continued nausea, vomiting, epigastric pain and diarrhea.  She is taking erythromycin for strep throat.  She says she has had several antibiotics for the strep throat.  Keeps getting antibiotics for.  She says erythromycin does not usually bother her.  She does not think it is what is going on now.  She is nauseated now.   She is complaining of some epigastric pain and pain above her umbilicus.  The pain is mild and slightly burning.  Patient has not had any vomiting or diarrhea for at least 3 hours.        Past Medical History:  Diagnosis Date  . ADHD (attention deficit hyperactivity disorder)   . Patella-femoral syndrome   . Restless legs     Patient Active Problem List   Diagnosis Date Noted  . Intermittent generalized abdominal pain 07/01/2018  . Trichomonas infection 06/15/2018    Past Surgical History:  Procedure Laterality Date  . UPPER GI ENDOSCOPY    . WISDOM TOOTH EXTRACTION      Prior to Admission medications   Medication Sig Start Date End Date Taking? Authorizing Provider  meloxicam (MOBIC) 15 MG tablet TK 1 T PO QD 06/07/18   [provider]  metroNIDAZOLE (FLAGYL) 500 MG tablet Take 1 tablet (500 mg total) by mouth 2 (two) times daily for 7 days. 08/10/18 08/17/18  Jonnie Kind, MD  norgestimate-ethinyl estradiol (ORTHO-CYCLEN,SPRINTEC,PREVIFEM) 0.25-35 MG-MCG tablet Take 1 tablet by mouth daily. 07/21/18   Roma Schanz, CNM  pramipexole (MIRAPEX) 0.5 MG  tablet Take 0.5 mg by mouth 3 (three) times daily.    [provider]  promethazine (PHENERGAN) 25 MG tablet Take 1 tablet (25 mg total) by mouth every 6 (six) hours as needed for nausea or vomiting. 08/15/18   Merlyn Lot, MD  traZODone (DESYREL) 50 MG tablet 25 mg at bedtime.  03/12/18   [provider]    Allergies Patient has no known allergies.  Family History  Problem Relation Age of Onset  . Lung cancer Paternal Grandfather   . Breast cancer Paternal Grandmother     Social History Social History   Tobacco Use  . Smoking status: Never Smoker  . Smokeless tobacco: Never Used  Substance Use Topics  . Alcohol use: Never    Frequency: Never  . Drug use: Never    Review of Systems  Constitutional: No fever/chills Eyes: No visual changes. ENT: No sore throat. Cardiovascular: Denies chest pain. Respiratory: Denies shortness of breath. Gastrointestinal: See HPI Genitourinary: Negative for dysuria. Musculoskeletal: Negative for back pain. Skin: Negative for rash. Neurological: Negative for headaches, focal weakness  ____________________________________________   PHYSICAL EXAM:  VITAL SIGNS: ED Triage Vitals  Enc Vitals Group     BP 08/15/18 2254 112/65     Pulse Rate 08/15/18 2254 93     Resp 08/15/18 2254 18     Temp 08/15/18 2254 98.6 F (37 C)     Temp Source 08/15/18 2254 Oral     SpO2 08/15/18 2254  100 %     Weight 08/15/18 2240 207 lb 3.7 oz (94 kg)     Height 08/15/18 2240 5\' 4"  (1.626 m)     Head Circumference --      Peak Flow --      Pain Score 08/15/18 2249 9     Pain Loc --      Pain Edu? --      Excl. in [place]? --     Constitutional: Alert and oriented. Well appearing and in no acute distress. Eyes: Conjunctivae are normal.  Head: Atraumatic. Nose: No congestion/rhinnorhea. Mouth/Throat: Mucous membranes are moist.  Oropharynx non-erythematous. Neck: No stridor.   Cardiovascular: Normal rate, regular rhythm. Grossly  normal heart sounds.  Good peripheral circulation. Respiratory: Normal respiratory effort.  No retractions. Lungs CTAB. Gastrointestinal: Soft and nontender. No distention. No abdominal bruits. No CVA tenderness. }Musculoskeletal: No lower extremity tenderness nor edema.   Neurologic:  Normal speech and language. No gross focal neurologic deficits are appreciated.  Skin:  Skin is warm, dry and intact.   ____________________________________________   LABS (all labs ordered are listed, but only abnormal results are displayed)  Labs Reviewed  GASTROINTESTINAL PANEL BY PCR, STOOL (REPLACES STOOL CULTURE)  C DIFFICILE QUICK SCREEN W PCR REFLEX   ____________________________________________  EKG   ____________________________________________  RADIOLOGY  ED MD interpretation:    Official radiology report(s): Dg Chest Port 1 View  Result Date: 08/15/2018 CLINICAL DATA:  Strep throat EXAM: PORTABLE CHEST 1 VIEW COMPARISON:  None. FINDINGS: Lungs are clear.  No pleural effusion or pneumothorax. The heart is normal in size. IMPRESSION: Normal chest radiographs. Electronically Signed   By: Julian Hy M.D.   On: 08/15/2018 05:58   US Abdomen Limited Ruq  Result Date: 08/15/2018 CLINICAL DATA:  Epigastric pain EXAM: ULTRASOUND ABDOMEN LIMITED RIGHT UPPER QUADRANT COMPARISON:  None. FINDINGS: Gallbladder: No gallstones, gallbladder wall thickening, or pericholecystic fluid. Negative sonographic Murphy's sign. Common bile duct: Diameter: 2 mm Liver: No focal lesion identified. Within normal limits in parenchymal echogenicity. Portal vein is patent on color Doppler imaging with normal direction of blood flow towards the liver. IMPRESSION: Negative right upper quadrant ultrasound. Electronically Signed   By: Julian Hy M.D.   On: 08/15/2018 06:52    ____________________________________________   PROCEDURES  Procedure(s) performed (including Critical Care):  Procedures    ____________________________________________   INITIAL IMPRESSION / ASSESSMENT AND PLAN / ED COURSE  Patient's epigastric pain resolved with GI cocktail.  We will wait for little bit and she has not had any nausea or vomiting we will let her go home.      ____________________________________________   FINAL CLINICAL IMPRESSION(S) / ED DIAGNOSES  Final diagnoses:  Nausea vomiting and diarrhea     ED Discharge Orders    None       Note:  This document was prepared using Dragon voice recognition software and may include unintentional dictation errors.    Nena Polio, MD 08/16/18 [phone]

## 2018-08-16 NOTE — ED Notes (Signed)
Pt talking on the phone at this time in NAD.

## 2018-08-16 NOTE — ED Notes (Signed)
Pt waiting patiently for treatment room; no vomiting noted, pt has not been to BR since arrival; awake and alert

## 2018-08-16 NOTE — Discharge Instructions (Addendum)
I know you do not usually have problems with erythromycin but this could be from erythromycin.  Please return if she has a fever.  Try some Pepto-Bismol if the diarrhea comes back.  If you have frequent episodes of vomiting and cannot keep down any fluids or have a lot of diarrhea or get lightheaded please return also.  Please follow-up with your regular doctor in the next couple days.

## 2018-08-27 ENCOUNTER — Other Ambulatory Visit: Payer: Self-pay | Admitting: Obstetrics and Gynecology

## 2018-08-27 DIAGNOSIS — N83202 Unspecified ovarian cyst, left side: Principal | ICD-10-CM

## 2018-08-27 DIAGNOSIS — N83201 Unspecified ovarian cyst, right side: Secondary | ICD-10-CM

## 2018-08-31 ENCOUNTER — Ambulatory Visit (INDEPENDENT_AMBULATORY_CARE_PROVIDER_SITE_OTHER): Payer: Medicaid Other

## 2018-08-31 ENCOUNTER — Other Ambulatory Visit: Payer: Self-pay | Admitting: Obstetrics and Gynecology

## 2018-08-31 ENCOUNTER — Other Ambulatory Visit: Payer: Self-pay

## 2018-08-31 DIAGNOSIS — N83202 Unspecified ovarian cyst, left side: Secondary | ICD-10-CM | POA: Diagnosis not present

## 2018-08-31 DIAGNOSIS — N83201 Unspecified ovarian cyst, right side: Secondary | ICD-10-CM

## 2018-08-31 NOTE — Progress Notes (Signed)
PELVIC US TA/TV : homogeneous anteverted uterus,wnl,EEC 6.8 mm,normal left ovary,two simple right ovarian cysts (#1) 4.8 x 3.9 x 4.6 cm,(#2) 4.5 x 3.3 x 3.3 cm,ovaries appear mobile,right adnexal pain,no free fluid

## 2018-09-01 ENCOUNTER — Encounter: Payer: Self-pay | Admitting: *Deleted

## 2018-09-01 ENCOUNTER — Emergency Department
Admission: EM | Admit: 2018-09-01 | Discharge: 2018-09-01 | Disposition: A | Discharge status: Home / Self Care | Payer: Medicaid Other | Attending: Emergency Medicine | Admitting: Emergency Medicine

## 2018-09-01 ENCOUNTER — Other Ambulatory Visit: Payer: Self-pay

## 2018-09-01 DIAGNOSIS — W268XXA Contact with other sharp object(s), not elsewhere classified, initial encounter: Secondary | ICD-10-CM | POA: Diagnosis not present

## 2018-09-01 DIAGNOSIS — S61219A Laceration without foreign body of unspecified finger without damage to nail, initial encounter: Secondary | ICD-10-CM

## 2018-09-01 DIAGNOSIS — S61209A Unspecified open wound of unspecified finger without damage to nail, initial encounter: Secondary | ICD-10-CM

## 2018-09-01 DIAGNOSIS — Y9289 Other specified places as the place of occurrence of the external cause: Secondary | ICD-10-CM | POA: Insufficient documentation

## 2018-09-01 DIAGNOSIS — S61215A Laceration without foreign body of left ring finger without damage to nail, initial encounter: Secondary | ICD-10-CM | POA: Insufficient documentation

## 2018-09-01 DIAGNOSIS — Y998 Other external cause status: Secondary | ICD-10-CM | POA: Insufficient documentation

## 2018-09-01 DIAGNOSIS — Z23 Encounter for immunization: Secondary | ICD-10-CM | POA: Insufficient documentation

## 2018-09-01 DIAGNOSIS — Y9389 Activity, other specified: Secondary | ICD-10-CM | POA: Diagnosis not present

## 2018-09-01 MED ORDER — TETANUS-DIPHTH-ACELL PERTUSSIS 5-2.5-18.5 LF-MCG/0.5 IM SUSP
0.5000 mL | Freq: Once | INTRAMUSCULAR | Status: AC
  Administered 2018-09-01: 17:00:00 0.5 mL via INTRAMUSCULAR
  Filled 2018-09-01: qty 0.5

## 2018-09-01 NOTE — ED Triage Notes (Signed)
Pt has small laceration to left 4th finger.  Cut on a tin can.  Bleeding controlled.  Pt alert.

## 2018-09-01 NOTE — ED Notes (Signed)
See triage note  Presents with laceration to left ring finger  States she was opening a can  Laceration noted to tip of finger  Bleeding controlled

## 2018-09-01 NOTE — ED Provider Notes (Signed)
[facility] Emergency Department Provider Note  ____________________________________________  Time seen: Approximately 4:41 PM  I have reviewed the triage vital signs and the nursing notes.   HISTORY  Chief Complaint Laceration    HPI Bianca Singleton is a 23 y.o. female presents emergency department for evaluation of fingertip laceration.  Patient cut her finger on a tin can.  Last tetanus shot was 9 years ago.   Past Medical History:  Diagnosis Date  . ADHD (attention deficit hyperactivity disorder)   . Patella-femoral syndrome   . Restless legs     Patient Active Problem List   Diagnosis Date Noted  . Intermittent generalized abdominal pain 07/01/2018  . Trichomonas infection 06/15/2018    Past Surgical History:  Procedure Laterality Date  . UPPER GI ENDOSCOPY    . WISDOM TOOTH EXTRACTION      Prior to Admission medications   Medication Sig Start Date End Date Taking? Authorizing Provider  norgestimate-ethinyl estradiol (ORTHO-CYCLEN,SPRINTEC,PREVIFEM) 0.25-35 MG-MCG tablet Take 1 tablet by mouth daily. 07/21/18   Roma Schanz, CNM  pramipexole (MIRAPEX) 0.5 MG tablet Take 0.5 mg by mouth 3 (three) times daily.    [provider]  traZODone (DESYREL) 50 MG tablet 25 mg at bedtime.  03/12/18   [provider]    Allergies Patient has no known allergies.  Family History  Problem Relation Age of Onset  . Lung cancer Paternal Grandfather   . Breast cancer Paternal Grandmother     Social History Social History   Tobacco Use  . Smoking status: Never Smoker  . Smokeless tobacco: Never Used  Substance Use Topics  . Alcohol use: Never    Frequency: Never  . Drug use: Never     Review of Systems  Musculoskeletal: Positive for finger pain. Skin: Negative for rash, ecchymosis.  Positive for laceration. Neurological: Negative for numbness or tingling   ____________________________________________   PHYSICAL  EXAM:  VITAL SIGNS: ED Triage Vitals  Enc Vitals Group     BP 09/01/18 1610 108/73     Pulse Rate 09/01/18 1607 83     Resp 09/01/18 1607 18     Temp 09/01/18 1607 98.2 F (36.8 C)     Temp Source 09/01/18 1607 Oral     SpO2 09/01/18 1607 99 %     Weight 09/01/18 1608 207 lb (93.9 kg)     Height 09/01/18 1608 5\' 4"  (1.626 m)     Head Circumference --      Peak Flow --      Pain Score 09/01/18 1608 10     Pain Loc --      Pain Edu? --      Excl. in [place]? --      Constitutional: Alert and oriented. Well appearing and in no acute distress. Eyes: Conjunctivae are normal. PERRL. EOMI. Head: Atraumatic. ENT:      Ears:      Nose: No congestion/rhinnorhea.      Mouth/Throat: Mucous membranes are moist.  Neck: No stridor. Cardiovascular: Normal rate, regular rhythm.  Good peripheral circulation. Respiratory: Normal respiratory effort without tachypnea or retractions. Lungs CTAB. Good air entry to the bases with no decreased or absent breath sounds. Musculoskeletal: Full range of motion to all extremities. No gross deformities appreciated. Neurologic:  Normal speech and language. No gross focal neurologic deficits are appreciated.  Skin:  Skin is warm, dry. 1/2cm shallow well approximated laceration to 4th finger.  Psychiatric: Mood and affect are normal. Speech and  behavior are normal. Patient exhibits appropriate insight and judgement.   ____________________________________________   LABS (all labs ordered are listed, but only abnormal results are displayed)  Labs Reviewed - No data to display ____________________________________________  EKG   ____________________________________________  RADIOLOGY   No results found.  ____________________________________________    PROCEDURES  Procedure(s) performed:    Procedures  LACERATION REPAIR Performed by: Laban Emperor  Consent: Verbal consent obtained.  Consent given by: patient  Prepped and Draped in  normal sterile fashion  Wound explored: No foreign bodies   Laceration Location: finger tip  Laceration Length: 1/2 cm  Amount of cleaning: 577ml normal saline  Skin closure: Dermabond  Patient tolerance: Patient tolerated the procedure well with no immediate complications.   Medications  Tdap (BOOSTRIX) injection 0.5 mL (0.5 mLs Intramuscular Given 09/01/18 1659)     ____________________________________________   INITIAL IMPRESSION / ASSESSMENT AND PLAN / ED COURSE  Pertinent labs & imaging results that were available during my care of the patient were reviewed by me and considered in my medical decision making (see chart for details).  Review of the [place] CSRS was performed in accordance of the [place] prior to dispensing any controlled drugs.     Patient presented to emergency department for evaluation of fingertip laceration.  Vital signs and exam are reassuring.  Laceration is very shallow and well approximated.  Laceration was repaired with Dermabond.  Tetanus shot was updated.  Patient is to follow up with primary care as directed. Patient is given ED precautions to return to the ED for any worsening or new symptoms.     ____________________________________________  FINAL CLINICAL IMPRESSION(S) / ED DIAGNOSES  Final diagnoses:  Cut of finger      NEW MEDICATIONS STARTED DURING THIS VISIT:  ED Discharge Orders    None          This chart was dictated using voice recognition software/Dragon. Despite best efforts to proofread, errors can occur which can change the meaning. Any change was purely unintentional.    Laban Emperor, PA-C 09/01/18 1755    Arta Silence, MD 09/01/18 2158

## 2018-09-02 ENCOUNTER — Telehealth: Payer: Self-pay | Admitting: Obstetrics and Gynecology

## 2018-09-02 NOTE — Telephone Encounter (Signed)
Pt requesting a call to discuss her last [place] results.

## 2018-09-03 ENCOUNTER — Emergency Department
Admission: EM | Admit: 2018-09-03 | Discharge: 2018-09-03 | Disposition: A | Discharge status: Home / Self Care | Payer: Medicaid Other | Attending: Emergency Medicine | Admitting: Emergency Medicine

## 2018-09-03 ENCOUNTER — Other Ambulatory Visit: Payer: Self-pay

## 2018-09-03 DIAGNOSIS — Z4800 Encounter for change or removal of nonsurgical wound dressing: Secondary | ICD-10-CM | POA: Diagnosis present

## 2018-09-03 DIAGNOSIS — Z5189 Encounter for other specified aftercare: Secondary | ICD-10-CM

## 2018-09-03 NOTE — ED Triage Notes (Signed)
Pt states she came here for lac to left ring finger 2 days ago and was closed with dermabond. Pt here for bleeding to area, no bleeding noted at this time.

## 2018-09-03 NOTE — ED Provider Notes (Signed)
[facility] Emergency Department Provider Note  ____________________________________________  Time seen: Approximately 10:25 PM  I have reviewed the triage vital signs and the nursing notes.   HISTORY  Chief Complaint Wound Check   HPI Bianca Singleton is a 23 y.o. female who presents to the emergency department for a wound check. She cut her finger on a can 2 days ago. Wound was repaired with Dermabond. She states that she removed the bandage today and noticed there was some blood on the bandage. She decided to come back to see if she needed stitches. Tdap booster given at her visit 2 days ago.   Past Medical History:  Diagnosis Date  . ADHD (attention deficit hyperactivity disorder)   . Patella-femoral syndrome   . Restless legs     Patient Active Problem List   Diagnosis Date Noted  . Intermittent generalized abdominal pain 07/01/2018  . Trichomonas infection 06/15/2018    Past Surgical History:  Procedure Laterality Date  . UPPER GI ENDOSCOPY    . WISDOM TOOTH EXTRACTION      Prior to Admission medications   Medication Sig Start Date End Date Taking? Authorizing Provider  norgestimate-ethinyl estradiol (ORTHO-CYCLEN,SPRINTEC,PREVIFEM) 0.25-35 MG-MCG tablet Take 1 tablet by mouth daily. 07/21/18   Roma Schanz, CNM  pramipexole (MIRAPEX) 0.5 MG tablet Take 0.5 mg by mouth 3 (three) times daily.    [provider]  traZODone (DESYREL) 50 MG tablet 25 mg at bedtime.  03/12/18   [provider]    Allergies Patient has no known allergies.  Family History  Problem Relation Age of Onset  . Lung cancer Paternal Grandfather   . Breast cancer Paternal Grandmother     Social History Social History   Tobacco Use  . Smoking status: Never Smoker  . Smokeless tobacco: Never Used  Substance Use Topics  . Alcohol use: Never    Frequency: Never  . Drug use: Never    Review of Systems  Constitutional: Negative for  fever. Respiratory: Negative for cough or shortness of breath.  Musculoskeletal: Negative for myalgias Skin: Positive for wound to ring fingertip of left hand. Neurological: Negative for numbness or paresthesias. ____________________________________________   PHYSICAL EXAM:  VITAL SIGNS: ED Triage Vitals [09/03/18 2116]  Enc Vitals Group     BP 129/68     Pulse Rate 87     Resp 20     Temp 98 F (36.7 C)     Temp Source Oral     SpO2 100 %     Weight 207 lb (93.9 kg)     Height 5\' 4"  (1.626 m)     Head Circumference      Peak Flow      Pain Score 7     Pain Loc      Pain Edu?      Excl. in [place]?      Constitutional: Well appearing. Eyes: Conjunctivae are clear without discharge or drainage. Nose: No rhinorrhea noted. Mouth/Throat: Airway is patent.  Neck: No stridor. Unrestricted range of motion observed. Cardiovascular: Capillary refill is <3 seconds.  Respiratory: Respirations are even and unlabored.. Musculoskeletal: Unrestricted range of motion observed. Neurologic: Awake, alert, and oriented x 4.  Skin:  Dermabond in place on the left ring fingertip without any active bleeding or evidence of infection.  ____________________________________________   LABS (all labs ordered are listed, but only abnormal results are displayed)  Labs Reviewed - No data to display ____________________________________________  EKG  Not indicated.  ____________________________________________  RADIOLOGY  Not indicated. ____________________________________________   PROCEDURES  Procedures ____________________________________________   INITIAL IMPRESSION / ASSESSMENT AND PLAN / ED COURSE  Bianca Singleton is a 23 y.o. female who presents to the ER for wound check. No additional interventions are necessary today. She is afraid the wound is going to reopen. RN applied some steri strips over the already closed laceration. Wound care was discussed with the patient. She is to  follow up with her PCP for additional concerns.   Medications - No data to display   Pertinent labs & imaging results that were available during my care of the patient were reviewed by me and considered in my medical decision making (see chart for details).  ____________________________________________   FINAL CLINICAL IMPRESSION(S) / ED DIAGNOSES  Final diagnoses:  Encounter for wound re-check    ED Discharge Orders    None       Note:  This document was prepared using Dragon voice recognition software and may include unintentional dictation errors.   Victorino Dike, FNP 09/04/18 1616    Schuyler Amor, MD 09/04/18 [phone]

## 2018-09-03 NOTE — ED Notes (Signed)
Reviewed discharge instructions, follow-up care, and wound care with patient. Patient verbalized understanding of all information reviewed. Patient stable, with no distress noted at this time.

## 2018-09-03 NOTE — ED Notes (Signed)
Small, closed laceration noted to fingertip, fourth digit, left hand. Dermabond in place. Scant amount of dried blood noted to area.

## 2018-09-03 NOTE — ED Notes (Signed)
Placed steristrips and finger splint per NP order

## 2018-09-08 ENCOUNTER — Telehealth: Payer: Self-pay | Admitting: *Deleted

## 2018-09-08 NOTE — Telephone Encounter (Signed)
I informed patient of ultrasound results. She is requesting a phone call from Dr. Glo Herring to discuss them and a plan. She has pain on right side. Is interested in surgical intervention. Advised patient that Dr. Glo Herring is not in the office today, and that I would send him a message with her concerns.

## 2018-09-10 ENCOUNTER — Telehealth: Payer: Self-pay | Admitting: Obstetrics and Gynecology

## 2018-09-10 NOTE — Telephone Encounter (Signed)
Pt contacted.  U/s reviewed. Pt has mild rlq pain with adeq pain relief with ibuprofen. Benign character of Rt ovarian cysts reviewed with Hildred Alamin. Pt advised to continue ocp and ibuprofen and f/u 2-3 months.  Note" pt seen by PCMD yesterday and Covid testing done due to uri sx. Pt awaiting results.  Plan: reassess 2-3 months

## 2018-09-23 ENCOUNTER — Telehealth: Payer: Self-pay | Admitting: Obstetrics and Gynecology

## 2018-09-23 MED ORDER — ACETAMINOPHEN-CODEINE #3 300-30 MG PO TABS: 2.0000 | ORAL_TABLET | Freq: Four times a day (QID) | ORAL | 0 refills | Status: DC | PRN

## 2018-09-23 NOTE — Telephone Encounter (Signed)
Patient unavailable by phone,  Will leave message by [place].

## 2018-10-13 ENCOUNTER — Ambulatory Visit: Payer: Medicaid Other | Admitting: Obstetrics and Gynecology

## 2018-10-14 ENCOUNTER — Encounter: Payer: Self-pay | Admitting: Obstetrics and Gynecology

## 2018-10-14 ENCOUNTER — Other Ambulatory Visit: Payer: Self-pay

## 2018-10-14 ENCOUNTER — Ambulatory Visit (INDEPENDENT_AMBULATORY_CARE_PROVIDER_SITE_OTHER): Payer: Medicaid Other | Admitting: Obstetrics and Gynecology

## 2018-10-14 VITALS — BP 102/62 | Temp 97.7°F | Ht 65.0 in | Wt 214.0 lb

## 2018-10-14 DIAGNOSIS — N83201 Unspecified ovarian cyst, right side: Secondary | ICD-10-CM | POA: Diagnosis not present

## 2018-10-14 NOTE — Progress Notes (Signed)
Patient ID: Bianca Singleton, female   DOB: 07/14/1995, 23 y.o.   MRN: [id number]    [facility] Visit  @DATE @            Patient name: Bianca Singleton MRN [id number]  Date of birth: February 22, 1996  CC & HPI:  Bianca Singleton is a 23 y.o. female presenting today for f/u of ovarian cysts. Currently on her period. Cyst on left ovary has gone away. Cysts on her right ovaries haven't increased in size significantly. Still having some pain but manageable. She describes pain as a 7/10 but then describes that as manageable.  ROS:  ROS +right ovarian cyst -fever  All systems are negative except as noted in the HPI and PMH.    Pertinent History Reviewed:   Reviewed:  Medical         Past Medical History:  Diagnosis Date  . ADHD (attention deficit hyperactivity disorder)   . Patella-femoral syndrome   . Restless legs                               Surgical Hx:    Past Surgical History:  Procedure Laterality Date  . UPPER GI ENDOSCOPY    . WISDOM TOOTH EXTRACTION     Medications: Reviewed & Updated - see associated section                       Current Outpatient Medications:  .  acetaminophen-codeine (TYLENOL #3) 300-30 MG tablet, Take 2 tablets by mouth every 6 (six) hours as needed for moderate pain., Disp: 30 tablet, Rfl: 0 .  norgestimate-ethinyl estradiol (ORTHO-CYCLEN,SPRINTEC,PREVIFEM) 0.25-35 MG-MCG tablet, Take 1 tablet by mouth daily., Disp: 1 Package, Rfl: 11 .  pramipexole (MIRAPEX) 0.5 MG tablet, Take 0.5 mg by mouth 3 (three) times daily., Disp: , Rfl:  .  traZODone (DESYREL) 50 MG tablet, 25 mg at bedtime. , Disp: , Rfl:    Social History: Reviewed -  reports that she has never smoked. She has never used smokeless tobacco.  Objective Findings:  Vitals: There were no vitals taken for this visit.  PHYSICAL EXAMINATION General appearance - alert, well appearing, and in no distress Mental status - alert, oriented to person, place, and time, normal mood, behavior, speech,  dress, motor activity, and thought processes, affect appropriate to mood  PELVIC DEFERRED due to period  Assessment & Plan:   A:  1. persistent right ovarian cyst, considered benign.  P:  1. continue Ortho-cyclen 2. Take medication continuously 3. F/u in August w/ TV US    By signing my name below, I, [name], attest that this documentation has been prepared under the direction and in the presence of Jonnie Kind, MD. Electronically Signed: [place]. 10/14/18. 2:59 PM.  I personally performed the services described in this documentation, which was SCRIBED in my presence. The recorded information has been reviewed and considered accurate. It has been edited as necessary during review. Jonnie Kind, MD

## 2018-10-20 ENCOUNTER — Other Ambulatory Visit: Payer: Self-pay | Admitting: Adult Health

## 2018-10-20 MED ORDER — PROMETHAZINE HCL 25 MG PO TABS: 25.0000 mg | ORAL_TABLET | Freq: Four times a day (QID) | ORAL | 1 refills | Status: AC | PRN

## 2018-10-20 NOTE — Progress Notes (Signed)
rx phenergan

## 2018-11-25 ENCOUNTER — Ambulatory Visit: Payer: Medicaid Other | Admitting: Obstetrics and Gynecology

## 2018-11-26 NOTE — Progress Notes (Signed)
Patient no-showed today's appointment.

## 2018-12-25 ENCOUNTER — Other Ambulatory Visit: Payer: Self-pay | Admitting: Obstetrics and Gynecology

## 2018-12-25 ENCOUNTER — Other Ambulatory Visit: Payer: Medicaid Other

## 2018-12-25 DIAGNOSIS — N83201 Unspecified ovarian cyst, right side: Secondary | ICD-10-CM

## 2018-12-28 ENCOUNTER — Encounter: Payer: Self-pay | Admitting: Obstetrics and Gynecology

## 2018-12-28 ENCOUNTER — Other Ambulatory Visit: Payer: Self-pay

## 2018-12-28 ENCOUNTER — Ambulatory Visit (INDEPENDENT_AMBULATORY_CARE_PROVIDER_SITE_OTHER): Payer: Medicaid Other | Admitting: Obstetrics and Gynecology

## 2018-12-28 ENCOUNTER — Ambulatory Visit (INDEPENDENT_AMBULATORY_CARE_PROVIDER_SITE_OTHER): Payer: Medicaid Other

## 2018-12-28 VITALS — BP 108/72 | HR 78 | Ht 64.0 in | Wt 214.6 lb

## 2018-12-28 DIAGNOSIS — N83201 Unspecified ovarian cyst, right side: Secondary | ICD-10-CM | POA: Diagnosis not present

## 2018-12-28 NOTE — Progress Notes (Signed)
PELVIC US TA/TV: homogeneous anteverted uterus,wnl,EEC 6.8 cm,normal left ovary,two simple cyst vs large cyst w/a septation,(#1) 4.1 x 3.2 x 4.9 cm,(#2) 3.9 x 3 x 4.7 cm with arterial and venous flow,no free fluid,pelvic pain during ultrasound,ovaries appear mobile

## 2018-12-28 NOTE — Progress Notes (Signed)
Patient ID: Bianca Singleton, female   DOB: 1995-08-28, 23 y.o.   MRN: [id number]   [facility] Visit  @DATE @            Patient name: Bianca Singleton MRN [id number]  Date of birth: 1996/01/04  CC & HPI:  Bianca Singleton is a 23 y.o. female presenting today for discussion of u/s done today. Was put on ortho-cyclen. Pain is still 8/10. Wants to get rid of cyst. Proposed surgery is laparoscopic removal. Has been having seizures recently. Is having EEG done on 8/17 and MRI on 8/25,  PELVIC US TA/TV: homogeneous anteverted uterus,wnl,EEC 6.8 cm,normal left ovary,two simple cyst vs large cyst w/a septation,(#1) 4.1 x 3.2 x 4.9 cm,(#2) 3.9 x 3 x 4.7 cm with arterial and venous flow to ovary, no intracystic solid component, no intracystic vascular flow.,no free fluid,pelvic pain during ultrasound,ovaries appear mobile  ROS:  ROS +2 right ovarian cyst -fever -chills  All systems are negative except as noted in the HPI and PMH.    Pertinent History Reviewed:   Reviewed:  Medical         Past Medical History:  Diagnosis Date  . ADHD (attention deficit hyperactivity disorder)   . Patella-femoral syndrome   . Restless legs   . Seizures ([place])                               Surgical Hx:    Past Surgical History:  Procedure Laterality Date  . UPPER GI ENDOSCOPY    . WISDOM TOOTH EXTRACTION     Medications: Reviewed & Updated - see associated section                       Current Outpatient Medications:  .  norgestimate-ethinyl estradiol (ORTHO-CYCLEN,SPRINTEC,PREVIFEM) 0.25-35 MG-MCG tablet, Take 1 tablet by mouth daily., Disp: 1 Package, Rfl: 11 .  pramipexole (MIRAPEX) 0.5 MG tablet, Take 0.5 mg by mouth 3 (three) times daily., Disp: , Rfl:  .  promethazine (PHENERGAN) 25 MG tablet, Take 1 tablet (25 mg total) by mouth every 6 (six) hours as needed for nausea or vomiting., Disp: 30 tablet, Rfl: 1 .  traZODone (DESYREL) 50 MG tablet, 25 mg at bedtime. , Disp: , Rfl:    Social History:  Reviewed -  reports that she has never smoked. She has never used smokeless tobacco.  Objective Findings:  Vitals: Blood pressure 108/72, pulse 78, height 5\' 4"  (1.626 m), weight 214 lb 9.6 oz (97.3 kg), last menstrual period 12/22/2018.  PHYSICAL EXAMINATION General appearance - alert, well appearing, and in no distress Mental status - alert, oriented to person, place, and time, normal mood, behavior, speech, dress, motor activity, and thought processes, affect appropriate to mood  PELVIC Deferred-discussion only   Assessment & Plan:   A:  1.  persistent right ovarian cyst, considered benign 2. Currently in middle of seizure w/u, need to complete before considering surgery.  P:  1. Proceed toward Right ovary laparoscopic cystectomy 2. F/u in 4 weeks for pre-op    By signing my name below, I, [name], attest that this documentation has been prepared under the direction and in the presence of Jonnie Kind, MD. Electronically Signed: [place]. 12/28/18. 9:54 AM.  I personally performed the services described in this documentation, which was SCRIBED in my presence. The recorded information has been reviewed and considered accurate. It has been  edited as necessary during review. Jonnie Kind, MD

## 2019-01-20 ENCOUNTER — Telehealth: Payer: Self-pay | Admitting: *Deleted

## 2019-01-20 NOTE — Telephone Encounter (Signed)
Patient called stating she is have severe lower abdominal cramping and passing clots.

## 2019-01-20 NOTE — Telephone Encounter (Addendum)
Pt ended period 2 days ago and woke up this am with severe abd cramping and passing clots. Pt is on Sprintec. Pt feels like she has to urinate, but is not urinating much. No fever. Pt to come in and leave a urine sample so we can send to lab. Pt has pre op scheduled for 9/9. Advised to keep that appt. Pt voiced understanding and call was transferred to [facility] to schedule. Bianca Singleton

## 2019-01-21 ENCOUNTER — Other Ambulatory Visit: Payer: Medicaid Other

## 2019-01-27 ENCOUNTER — Encounter: Payer: Medicaid Other | Admitting: Obstetrics and Gynecology

## 2019-02-10 ENCOUNTER — Other Ambulatory Visit: Payer: Self-pay

## 2019-02-10 ENCOUNTER — Encounter: Payer: Self-pay | Admitting: Obstetrics and Gynecology

## 2019-02-10 ENCOUNTER — Ambulatory Visit (INDEPENDENT_AMBULATORY_CARE_PROVIDER_SITE_OTHER): Payer: Medicaid Other | Admitting: Obstetrics and Gynecology

## 2019-02-10 VITALS — BP 122/75 | HR 111 | Ht 64.0 in | Wt 212.0 lb

## 2019-02-10 DIAGNOSIS — Z01812 Encounter for preprocedural laboratory examination: Secondary | ICD-10-CM | POA: Diagnosis not present

## 2019-02-10 NOTE — Progress Notes (Addendum)
Patient ID: Bianca Singleton, female   DOB: 05/10/1996, 23 y.o.   MRN: [id number]  Preoperative History and Physical  Bianca Singleton is a 23 y.o. G1P0010 here for surgical management of persistent painful  right ovarian cyst.  No significant preoperative concerns, has no pain today but over past month right side pain is severe, rated a 10/10 earlier this month but pt rested and it gradually improved  She was seen @  [facility] for for  seizures, had ECG and MRI done and ambulatory ECG for 2 days at home, all reported as negative.. When has she has seizures stares off into space, can hear but can't respond she denies grand mal seizures. She has had a thorough listing of alternatives including continued monitoring on OCP's, rationale for proceeding, risks of surgery, and possible complications, up to and including the postop diagnosis of unexpected malignancy. The risk of such findings is considered low. The patient desires that we proceed to surgery due to the severity of the intermittent pain episodes.  Exam today reproduces the pain when the right ovary/adnexa is palpated. . Proposed surgery: laparoscopic Right ovarian cystectomy.  Past Medical History:  Diagnosis Date   ADHD (attention deficit hyperactivity disorder)    Patella-femoral syndrome    Restless legs    Seizures ([facility])    Past Surgical History:  Procedure Laterality Date   UPPER GI ENDOSCOPY     WISDOM TOOTH EXTRACTION     OB History  Gravida Para Term Preterm AB Living  1       1    SAB TAB Ectopic Multiple Live Births  1            # Outcome Date GA Lbr Len/2nd Weight Sex Delivery Anes PTL Lv  1 SAB           Patient denies any other pertinent gynecologic issues.   Current Outpatient Medications on File Prior to Visit  Medication Sig Dispense Refill   norgestimate-ethinyl estradiol (ORTHO-CYCLEN,SPRINTEC,PREVIFEM) 0.25-35 MG-MCG tablet Take 1 tablet by mouth daily. 1 Package 11   pramipexole (MIRAPEX) 0.5 MG  tablet Take 0.5 mg by mouth 3 (three) times daily.     promethazine (PHENERGAN) 25 MG tablet Take 1 tablet (25 mg total) by mouth every 6 (six) hours as needed for nausea or vomiting. 30 tablet 1   traZODone (DESYREL) 50 MG tablet 25 mg at bedtime.      No current facility-administered medications on file prior to visit.    No Known Allergies  Social History:   reports that she has never smoked. She has never used smokeless tobacco. She reports that she does not drink alcohol or use drugs.  Family History  Problem Relation Age of Onset   Lung cancer Paternal Grandfather    Breast cancer Paternal Grandmother     Review of Systems: Noncontributory  PHYSICAL EXAM: Blood pressure 122/75, pulse (!) 111, height 5\' 4"  (1.626 m), weight 212 lb (96.2 kg). General appearance - alert, well appearing, and in no distress Chest - clear to auscultation, no wheezes, rales or rhonchi, symmetric air entry Heart - normal rate and regular rhythm Abdomen - soft, nontender, nondistended, no masses or organomegaly Pelvic - VAGINA: normal milky appearing discharge CERVIX: normal appearing cervix  UTERUS: uterus is normal size, shape, consistency and nontender. Ovary-right sided cyst, tender, enlarged, 6+ cm, elevated at pelvic brim, not in cul de sac.  Extremities - peripheral pulses normal, no pedal edema, no clubbing or cyanosis  Labs: No  results found for this or any previous visit (from the past 336 hour(s)).  Imaging Studies: No results found.  Assessment: Patient Active Problem List   Diagnosis Date Noted   Intermittent generalized abdominal pain 07/01/2018   Trichomonas infection 06/15/2018    Plan: Patient will undergo surgical management with Laparoscopic right ovarian cystectomy.  GC and Chl collected.  By signing my name below, I, Bianca Singleton, attest that this documentation has been prepared under the direction and in the presence of Bianca Kind, MD. Electronically  Signed: [place]. 02/10/19. 9:03 AM.  I personally performed the services described in this documentation, which was SCRIBED in my presence. The recorded information has been reviewed and considered accurate. It has been edited as necessary during review. Bianca Kind, MD

## 2019-02-14 LAB — GC/CHLAMYDIA PROBE AMP
Chlamydia trachomatis, NAA: NEGATIVE
Neisseria Gonorrhoeae by PCR: NEGATIVE

## 2019-02-28 ENCOUNTER — Telehealth: Payer: Self-pay | Admitting: Obstetrics and Gynecology

## 2019-02-28 ENCOUNTER — Other Ambulatory Visit: Payer: Self-pay | Admitting: Obstetrics and Gynecology

## 2019-02-28 MED ORDER — TRAMADOL HCL 50 MG PO TABS: 50.0000 mg | ORAL_TABLET | Freq: Four times a day (QID) | ORAL | 0 refills | Status: DC | PRN

## 2019-02-28 NOTE — Progress Notes (Signed)
Tramadol 50 mg x 20 tabs escribed to pharmacy. Message left for Ms. Gsell.

## 2019-02-28 NOTE — Telephone Encounter (Signed)
Patient unavailable by phone. Pt has ovarian cystectomy surgery scheduled.  Symptomatic ovary type pain suspected. Will call in Tramadol x5 days,  Pt to call office with an update early this week.

## 2019-03-01 ENCOUNTER — Telehealth: Payer: Self-pay | Admitting: Obstetrics and Gynecology

## 2019-03-01 ENCOUNTER — Telehealth: Payer: Self-pay | Admitting: *Deleted

## 2019-03-01 ENCOUNTER — Other Ambulatory Visit: Payer: Self-pay | Admitting: Obstetrics and Gynecology

## 2019-03-01 MED ORDER — ACETAMINOPHEN-CODEINE #3 300-30 MG PO TABS: 1.0000 | ORAL_TABLET | ORAL | 0 refills | Status: DC | PRN

## 2019-03-01 NOTE — Telephone Encounter (Signed)
I have made the switch and sent the Rx in.

## 2019-03-01 NOTE — Telephone Encounter (Signed)
Patient is describing pain as "excruciating " in messages. Offered appt at 11:00  Tomorrow.for exam and eval.

## 2019-03-01 NOTE — Telephone Encounter (Signed)
Patient left message for Dr. Glo Herring. She wants to let him know that she can't take tramadol because it interacts with her trazodone. She usually gets Tylenol #3. Would like that sent in instead.

## 2019-03-01 NOTE — Progress Notes (Signed)
Tylenol 3 x 30 tabs sent in as Ultram d/c'd due to cross reactvity.

## 2019-03-02 ENCOUNTER — Ambulatory Visit (INDEPENDENT_AMBULATORY_CARE_PROVIDER_SITE_OTHER): Payer: Medicaid Other | Admitting: Obstetrics and Gynecology

## 2019-03-02 ENCOUNTER — Other Ambulatory Visit: Payer: Self-pay

## 2019-03-02 ENCOUNTER — Encounter: Payer: Self-pay | Admitting: Obstetrics and Gynecology

## 2019-03-02 VITALS — BP 105/70 | HR 88 | Ht 64.0 in | Wt 208.4 lb

## 2019-03-02 DIAGNOSIS — R1084 Generalized abdominal pain: Secondary | ICD-10-CM | POA: Diagnosis not present

## 2019-03-02 DIAGNOSIS — N83201 Unspecified ovarian cyst, right side: Secondary | ICD-10-CM | POA: Diagnosis not present

## 2019-03-02 NOTE — Progress Notes (Signed)
[facility] Visit  @DATE @            Patient name: Bianca Singleton MRN [id number]  Date of birth: [birth year]  CC & HPI:  Bianca Singleton is a 23 y.o. female presenting today for reassessment of pelvic discomfort.  She is text message to our office indicating severe pain which she reports is a 7 out of 10.  It is intermittent pain.  Today she describes the pain as a 7 out of 10 again but it been bothering her for the last 3 days but she is able to walk ambulate without any change in gait or change in pace of movement she is conversing normally and tolerates exam surprisingly well for 7 out of 10 pain description.  She has not picked up her Vicodin prescription.  She cannot take tramadol due to side effect of potentiating her sleep medication "I sleep for 3 days if i take them together".   ROS:  ROS Recent exam showed the ovary to be enlarged 6 cm and at the pelvic brim..  Patient tolerates diet has normal bowel function he has no fever. Recent GC and Chlamydia were negative  Pertinent History Reviewed:   Reviewed: Significant for persistent ovarian cyst by ultrasound x2 this year in April and August.  The right ovary remains enlarged, the left ovary had a cyst in April which resolved Medical         Past Medical History:  Diagnosis Date  . ADHD (attention deficit hyperactivity disorder)   . Patella-femoral syndrome   . Restless legs   . Seizures ([place])                               Surgical Hx:    Past Surgical History:  Procedure Laterality Date  . UPPER GI ENDOSCOPY    . WISDOM TOOTH EXTRACTION     Medications: Reviewed & Updated - see associated section                       Current Outpatient Medications:  .  norgestimate-ethinyl estradiol (ORTHO-CYCLEN,SPRINTEC,PREVIFEM) 0.25-35 MG-MCG tablet, Take 1 tablet by mouth daily., Disp: 1 Package, Rfl: 11 .  pramipexole (MIRAPEX) 0.5 MG tablet, Take 0.5 mg by mouth 3 (three) times daily., Disp: , Rfl:  .  promethazine (PHENERGAN)  25 MG tablet, Take 1 tablet (25 mg total) by mouth every 6 (six) hours as needed for nausea or vomiting., Disp: 30 tablet, Rfl: 1 .  traZODone (DESYREL) 50 MG tablet, 25 mg at bedtime. , Disp: , Rfl:  .  acetaminophen-codeine (TYLENOL #3) 300-30 MG tablet, Take 1-2 tablets by mouth every 4 (four) hours as needed for moderate pain. (Patient not taking: Reported on 03/02/2019), Disp: 30 tablet, Rfl: 0   Social History: Reviewed -  reports that she has never smoked. She has never used smokeless tobacco.  Objective Findings:  Vitals: Blood pressure 105/70, pulse 88, height 5\' 4"  (1.626 m), weight 208 lb 6.4 oz (94.5 kg), last menstrual period 02/15/2019.  PHYSICAL EXAMINATION General appearance - alert, well appearing, and in no distress, oriented to person, place, and time, normal appearing weight, well hydrated and appears to be tolerating her perceived pain well Mental status - alert, oriented to person, place, and time, normal mood, behavior, speech, dress, motor activity, and thought processes Chest -  Heart - normal rate and regular rhythm Abdomen - soft, nontender, nondistended,  no masses or organomegaly Breasts -  Skin -  Mild right lower quadrant discomfort to palpation no guarding or rebound PELVIC External genitalia -normal Vulva -normal Vagina -generous clear physiologic appearing discharge Cervix -tiny mobile nontender  Uterus -anteflexed nontender normal size Adnexa -nontender left adnexa, right adnexa has the enlarged cystic ovary at the pelvic brim which is tender to palpation causing the patient to grimace on bimanual exam but not to guard or or change position [employer] -  Rectal -  , rectal exam not indicated    Assessment & Plan:   A:  1. Persistent right ovarian cyst already scheduled for surgery 10 November  P:  1. Current management with oral Tylenol 3, continuing oral contraceptives and proceed with surgery as scheduled 2. Patient is promised that we make  every effort to refill Tylenol 3 as necessary to get her to surgery

## 2019-03-15 ENCOUNTER — Other Ambulatory Visit: Payer: Self-pay | Admitting: Obstetrics and Gynecology

## 2019-03-15 MED ORDER — ACETAMINOPHEN-CODEINE #3 300-30 MG PO TABS: 1.0000 | ORAL_TABLET | ORAL | 0 refills | Status: DC | PRN

## 2019-03-24 ENCOUNTER — Other Ambulatory Visit: Payer: Self-pay | Admitting: Obstetrics and Gynecology

## 2019-03-24 NOTE — Progress Notes (Signed)
[facility] Visit  @DATE @            Patient name: Bianca Singleton         MRN [id number]  Date of birth: [birth year]  CC & HPI:  Bianca Singleton is a 23 y.o. female presenting today for reassessment of pelvic discomfort.  She is text message to our office indicating severe pain which she reports is a 7 out of 10.  It is intermittent pain.  Today she describes the pain as a 7 out of 10 again but it been bothering her for the last 3 days but she is able to walk ambulate without any change in gait or change in pace of movement she is conversing normally and tolerates exam surprisingly well for 7 out of 10 pain description.  She has not picked up her Vicodin prescription.  She cannot take tramadol due to side effect of potentiating her sleep medication "I sleep for 3 days if i take them together".   ROS:  ROS Recent exam showed the ovary to be enlarged 6 cm and at the pelvic brim..  Patient tolerates diet has normal bowel function he has no fever. Recent GC and Chlamydia were negative  Pertinent History Reviewed:   Reviewed: Significant for persistent ovarian cyst by ultrasound x2 this year in April and August.  The right ovary remains enlarged, the left ovary had a cyst in April which resolved Medical             Past Medical History:  Diagnosis Date  . ADHD (attention deficit hyperactivity disorder)   . Patella-femoral syndrome   . Restless legs   . Seizures ([place])                               Surgical Hx:         Past Surgical History:  Procedure Laterality Date  . UPPER GI ENDOSCOPY    . WISDOM TOOTH EXTRACTION     Medications: Reviewed & Updated - see associated section                       Current Outpatient Medications:  .  norgestimate-ethinyl estradiol (ORTHO-CYCLEN,SPRINTEC,PREVIFEM) 0.25-35 MG-MCG tablet, Take 1 tablet by mouth daily., Disp: 1 Package, Rfl: 11 .  pramipexole (MIRAPEX) 0.5 MG tablet, Take 0.5 mg by mouth 3 (three) times daily., Disp: , Rfl:   .  promethazine (PHENERGAN) 25 MG tablet, Take 1 tablet (25 mg total) by mouth every 6 (six) hours as needed for nausea or vomiting., Disp: 30 tablet, Rfl: 1 .  traZODone (DESYREL) 50 MG tablet, 25 mg at bedtime. , Disp: , Rfl:  .  acetaminophen-codeine (TYLENOL #3) 300-30 MG tablet, Take 1-2 tablets by mouth every 4 (four) hours as needed for moderate pain. (Patient not taking: Reported on 03/02/2019), Disp: 30 tablet, Rfl: 0   Social History: Reviewed -  reports that she has never smoked. She has never used smokeless tobacco.  Objective Findings:  Vitals: Blood pressure 105/70, pulse 88, height 5\' 4"  (1.626 m), weight 208 lb 6.4 oz (94.5 kg), last menstrual period 02/15/2019.  PHYSICAL EXAMINATION General appearance - alert, well appearing, and in no distress, oriented to person, place, and time, normal appearing weight, well hydrated and appears to be tolerating her perceived pain well Mental status - alert, oriented to person, place, and time, normal mood, behavior, speech, dress, motor activity, and thought  processes Chest -  Heart - normal rate and regular rhythm Abdomen - soft, nontender, nondistended, no masses or organomegaly Breasts -  Skin -  Mild right lower quadrant discomfort to palpation no guarding or rebound PELVIC External genitalia -normal Vulva -normal Vagina -generous clear physiologic appearing discharge Cervix -tiny mobile nontender  Uterus -anteflexed nontender normal size Adnexa -nontender left adnexa, right adnexa has the enlarged cystic ovary at the pelvic brim which is tender to palpation causing the patient to grimace on bimanual exam but not to guard or or change position [employer] -  Rectal -  , rectal exam not indicated    Assessment & Plan:   A:  1. Persistent right ovarian cyst already scheduled for surgery 10 November  P:  1. Current management with oral Tylenol 3, continuing oral contraceptives and proceed with surgery as  scheduled 2. Patient is promised that we make every effort to refill Tylenol 3 as necessary to get her to surgery

## 2019-03-25 NOTE — Patient Instructions (Signed)
Bianca Singleton  03/25/2019     @PREFPERIOPPHARMACY @   Your procedure is scheduled on  03/30/2019 .  Report to [facility] at  [place]  A.M.  Call this number if you have problems the morning of surgery:  [phone]   Remember:  Do not eat or drink after midnight.  Follow the enclosed instructions for your bowel prep.                      Take these medicines the morning of surgery with A SIP OF WATER tylenol#3 (if needed), phenergan (if needed).    Do not wear jewelry, make-up or nail polish.  Do not wear lotions, powders, or perfumes. Please wear deodorant and brush your teeth.  Do not shave 48 hours prior to surgery.  Men may shave face and neck.  Do not bring valuables to the hospital.  [facility] is not responsible for any belongings or valuables.  Contacts, dentures or bridgework may not be worn into surgery.  Leave your suitcase in the car.  After surgery it may be brought to your room.  For patients admitted to the hospital, discharge time will be determined by your treatment team.  Patients discharged the day of surgery will not be allowed to drive home.   Name and phone number of your driver:   family Special instructions:  None  Please read over the following fact sheets that you were given. Anesthesia Post-op Instructions and Care and Recovery After Surgery       Ovarian Cystectomy, Care After This sheet gives you information about how to care for yourself after your procedure. Your health care provider may also give you more specific instructions. If you have problems or questions, contact your health care provider. What can I expect after the procedure? After the procedure, it is common to have:  Pain in your abdomen, especially at the incision areas. You will be given pain medicines to control the pain.  Tiredness. This is a normal part of the recovery process. Your energy level will return to normal over the next several weeks.  Problems  passing stool (constipation). Follow these instructions at home: Medicines  Take over-the-counter and prescription medicines only as told by your health care provider.  If you were prescribed an antibiotic medicine, use it as told by your health care provider. Do not stop using the antibiotic even if you start to feel better.  Do not take aspirin because it can cause bleeding.  Do not drink alcohol while taking prescription pain medicine.  Do not drive or use heavy machinery while taking prescription pain medicine. Incision care   Follow instructions from your health care provider about how to take care of your incisions. Make sure you: ? Wash your hands with soap and water before you change your bandage (dressing). If soap and water are not available, use hand sanitizer. ? Change your dressing as told by your health care provider. ? Leave stitches (sutures), skin glue, or adhesive strips in place. These skin closures may need to stay in place for 2 weeks or longer. If adhesive strip edges start to loosen and curl up, you may trim the loose edges. Do not remove adhesive strips completely unless your health care provider tells you to do that.  Check your incision areas every day for signs of infection. Check for: ? Redness, swelling, or pain. ? Fluid or blood. ? Warmth. ? Pus or  a bad smell.  Do not take baths, swim, or use a hot tub until your health care provider approves. Take showers instead of baths. Activity  Return to your normal activities and diet as told by your health care provider. Ask your health care provider what activities are safe for you.  Take rest breaks during the day as needed.  Do not drive until your health care provider approves. General instructions  Do not douche, use tampons, or have sexual intercourse until your health care provider says it is okay to do so.  To prevent or treat constipation while you are taking prescription pain medicine, your  health care provider may recommend that you: ? Take over-the-counter or prescription medicines. ? Eat foods that are high in fiber, such as fresh fruits and vegetables, whole grains, and beans. ? Drink enough fluid to keep your urine clear or pale yellow. ? Limit foods that are high in fat and processed sugars, such as fried and sweet foods.  Keep all follow-up visits as told by your health care provider. This is important. Contact a health care provider if:  You have a fever.  You feel nauseous or you vomit.  You have pain when you urinate or have blood in your urine.  You have a rash on your body.  You have pain or redness where the IV was inserted.  You have pain that is not relieved with medicine.  You have signs of infection, such as: ? Redness, swelling, or pain around your incisions. ? Fluid or blood coming from your incisions. ? An incision that feels warm to the touch. ? Pus or a bad smell coming from your incisions. Get help right away if:  You have chest pain or shortness of breath.  You feel dizzy or light-headed.  You have increasing abdominal pain that is not relieved with medicines.  You have pain, swelling, or redness in your leg.  Your incision is opening (the edges are not staying together). Summary  After the procedure, it is common to have some pain in your abdomen. You will be given pain medicines to control the pain.  Follow instructions from your health care provider about how to take care of your incisions.  Do not douche, use tampons, or have sexual intercourse until your health care provider says it is okay to do so.  Keep all follow-up visits as told by your health care provider. This is important. This information is not intended to replace advice given to you by your health care provider. Make sure you discuss any questions you have with your health care provider. Document Released: 02/24/2013 Document Revised: 04/18/2017 Document Reviewed:  06/25/2016 Elsevier Patient Education  2020 [place] Anesthesia, Adult, Care After This sheet gives you information about how to care for yourself after your procedure. Your health care provider may also give you more specific instructions. If you have problems or questions, contact your health care provider. What can I expect after the procedure? After the procedure, the following side effects are common:  Pain or discomfort at the IV site.  Nausea.  Vomiting.  Sore throat.  Trouble concentrating.  Feeling cold or chills.  Weak or tired.  Sleepiness and fatigue.  Soreness and body aches. These side effects can affect parts of the body that were not involved in surgery. Follow these instructions at home:  For at least 24 hours after the procedure:  Have a responsible adult stay with you. It is important to  have someone help care for you until you are awake and alert.  Rest as needed.  Do not: ? Participate in activities in which you could fall or become injured. ? Drive. ? Use heavy machinery. ? Drink alcohol. ? Take sleeping pills or medicines that cause drowsiness. ? Make important decisions or sign legal documents. ? Take care of children on your own. Eating and drinking  Follow any instructions from your health care provider about eating or drinking restrictions.  When you feel hungry, start by eating small amounts of foods that are soft and easy to digest (bland), such as toast. Gradually return to your regular diet.  Drink enough fluid to keep your urine pale yellow.  If you vomit, rehydrate by drinking water, juice, or clear broth. General instructions  If you have sleep apnea, surgery and certain medicines can increase your risk for breathing problems. Follow instructions from your health care provider about wearing your sleep device: ? Anytime you are sleeping, including during daytime naps. ? While taking prescription pain medicines,  sleeping medicines, or medicines that make you drowsy.  Return to your normal activities as told by your health care provider. Ask your health care provider what activities are safe for you.  Take over-the-counter and prescription medicines only as told by your health care provider.  If you smoke, do not smoke without supervision.  Keep all follow-up visits as told by your health care provider. This is important. Contact a health care provider if:  You have nausea or vomiting that does not get better with medicine.  You cannot eat or drink without vomiting.  You have pain that does not get better with medicine.  You are unable to pass urine.  You develop a skin rash.  You have a fever.  You have redness around your IV site that gets worse. Get help right away if:  You have difficulty breathing.  You have chest pain.  You have blood in your urine or stool, or you vomit blood. Summary  After the procedure, it is common to have a sore throat or nausea. It is also common to feel tired.  Have a responsible adult stay with you for the first 24 hours after general anesthesia. It is important to have someone help care for you until you are awake and alert.  When you feel hungry, start by eating small amounts of foods that are soft and easy to digest (bland), such as toast. Gradually return to your regular diet.  Drink enough fluid to keep your urine pale yellow.  Return to your normal activities as told by your health care provider. Ask your health care provider what activities are safe for you. This information is not intended to replace advice given to you by your health care provider. Make sure you discuss any questions you have with your health care provider. Document Released: 08/12/2000 Document Revised: 05/09/2017 Document Reviewed: 12/20/2016 Elsevier Patient Education  2020 [employer]. How to Use Chlorhexidine for Bathing Chlorhexidine gluconate (CHG) is a  germ-killing (antiseptic) solution that is used to clean the skin. It can get rid of the bacteria that normally live on the skin and can keep them away for about 24 hours. To clean your skin with CHG, you may be given:  A CHG solution to use in the shower or as part of a sponge bath.  A prepackaged cloth that contains CHG. Cleaning your skin with CHG may help lower the risk for infection:  While you are  staying in the intensive care unit of the hospital.  If you have a vascular access, such as a central line, to provide short-term or long-term access to your veins.  If you have a catheter to drain urine from your bladder.  If you are on a ventilator. A ventilator is a machine that helps you breathe by moving air in and out of your lungs.  After surgery. What are the risks? Risks of using CHG include:  A skin reaction.  Hearing loss, if CHG gets in your ears.  Eye injury, if CHG gets in your eyes and is not rinsed out.  The CHG product catching fire. Make sure that you avoid smoking and flames after applying CHG to your skin. Do not use CHG:  If you have a chlorhexidine allergy or have previously reacted to chlorhexidine.  On babies younger than 34 months of age. How to use CHG solution  Use CHG only as told by your health care provider, and follow the instructions on the label.  Use the full amount of CHG as directed. Usually, this is one bottle. During a shower Follow these steps when using CHG solution during a shower (unless your health care provider gives you different instructions): 1. Start the shower. 2. Use your normal soap and shampoo to wash your face and hair. 3. Turn off the shower or move out of the shower stream. 4. Pour the CHG onto a clean washcloth. Do not use any type of brush or rough-edged sponge. 5. Starting at your neck, lather your body down to your toes. Make sure you follow these instructions: ? If you will be having surgery, pay special attention  to the part of your body where you will be having surgery. Scrub this area for at least 1 minute. ? Do not use CHG on your head or face. If the solution gets into your ears or eyes, rinse them well with water. ? Avoid your genital area. ? Avoid any areas of skin that have broken skin, cuts, or scrapes. ? Scrub your back and under your arms. Make sure to wash skin folds. 6. Let the lather sit on your skin for 1-2 minutes or as long as told by your health care provider. 7. Thoroughly rinse your entire body in the shower. Make sure that all body creases and crevices are rinsed well. 8. Dry off with a clean towel. Do not put any substances on your body afterward--such as powder, lotion, or perfume--unless you are told to do so by your health care provider. Only use lotions that are recommended by the manufacturer. 9. Put on clean clothes or pajamas. 10. If it is the night before your surgery, sleep in clean sheets.  During a sponge bath Follow these steps when using CHG solution during a sponge bath (unless your health care provider gives you different instructions): 1. Use your normal soap and shampoo to wash your face and hair. 2. Pour the CHG onto a clean washcloth. 3. Starting at your neck, lather your body down to your toes. Make sure you follow these instructions: ? If you will be having surgery, pay special attention to the part of your body where you will be having surgery. Scrub this area for at least 1 minute. ? Do not use CHG on your head or face. If the solution gets into your ears or eyes, rinse them well with water. ? Avoid your genital area. ? Avoid any areas of skin that have broken skin, cuts, or  scrapes. ? Scrub your back and under your arms. Make sure to wash skin folds. 4. Let the lather sit on your skin for 1-2 minutes or as long as told by your health care provider. 5. Using a different clean, wet washcloth, thoroughly rinse your entire body. Make sure that all body creases  and crevices are rinsed well. 6. Dry off with a clean towel. Do not put any substances on your body afterward--such as powder, lotion, or perfume--unless you are told to do so by your health care provider. Only use lotions that are recommended by the manufacturer. 7. Put on clean clothes or pajamas. 8. If it is the night before your surgery, sleep in clean sheets. How to use CHG prepackaged cloths  Only use CHG cloths as told by your health care provider, and follow the instructions on the label.  Use the CHG cloth on clean, dry skin.  Do not use the CHG cloth on your head or face unless your health care provider tells you to.  When washing with the CHG cloth: ? Avoid your genital area. ? Avoid any areas of skin that have broken skin, cuts, or scrapes. Before surgery Follow these steps when using a CHG cloth to clean before surgery (unless your health care provider gives you different instructions): 1. Using the CHG cloth, vigorously scrub the part of your body where you will be having surgery. Scrub using a back-and-forth motion for 3 minutes. The area on your body should be completely wet with CHG when you are done scrubbing. 2. Do not rinse. Discard the cloth and let the area air-dry. Do not put any substances on the area afterward, such as powder, lotion, or perfume. 3. Put on clean clothes or pajamas. 4. If it is the night before your surgery, sleep in clean sheets.  For general bathing Follow these steps when using CHG cloths for general bathing (unless your health care provider gives you different instructions). 1. Use a separate CHG cloth for each area of your body. Make sure you wash between any folds of skin and between your fingers and toes. Wash your body in the following order, switching to a new cloth after each step: ? The front of your neck, shoulders, and chest. ? Both of your arms, under your arms, and your hands. ? Your stomach and groin area, avoiding the  genitals. ? Your right leg and foot. ? Your left leg and foot. ? The back of your neck, your back, and your buttocks. 2. Do not rinse. Discard the cloth and let the area air-dry. Do not put any substances on your body afterward--such as powder, lotion, or perfume--unless you are told to do so by your health care provider. Only use lotions that are recommended by the manufacturer. 3. Put on clean clothes or pajamas. Contact a health care provider if:  Your skin gets irritated after scrubbing.  You have questions about using your solution or cloth. Get help right away if:  Your eyes become very red or swollen.  Your eyes itch badly.  Your skin itches badly and is red or swollen.  Your hearing changes.  You have trouble seeing.  You have swelling or tingling in your mouth or throat.  You have trouble breathing.  You swallow any chlorhexidine. Summary  Chlorhexidine gluconate (CHG) is a germ-killing (antiseptic) solution that is used to clean the skin. Cleaning your skin with CHG may help to lower your risk for infection.  You may be given CHG  to use for bathing. It may be in a bottle or in a prepackaged cloth to use on your skin. Carefully follow your health care provider's instructions and the instructions on the product label.  Do not use CHG if you have a chlorhexidine allergy.  Contact your health care provider if your skin gets irritated after scrubbing. This information is not intended to replace advice given to you by your health care provider. Make sure you discuss any questions you have with your health care provider. Document Released: 01/29/2012 Document Revised: 07/23/2018 Document Reviewed: 04/03/2017 Elsevier Patient Education  2020 [employer].

## 2019-03-26 ENCOUNTER — Encounter (HOSPITAL_COMMUNITY): Payer: Self-pay

## 2019-03-26 ENCOUNTER — Other Ambulatory Visit: Payer: Self-pay

## 2019-03-26 ENCOUNTER — Other Ambulatory Visit (HOSPITAL_COMMUNITY)
Admission: RE | Admit: 2019-03-26 | Discharge: 2019-03-26 | Disposition: A | Discharge status: Home / Self Care | Payer: Medicaid Other | Source: Ambulatory Visit | Attending: Obstetrics and Gynecology | Admitting: Obstetrics and Gynecology

## 2019-03-26 ENCOUNTER — Encounter (HOSPITAL_COMMUNITY)
Admission: RE | Admit: 2019-03-26 | Discharge: 2019-03-26 | Disposition: A | Discharge status: Home / Self Care | Payer: Medicaid Other | Source: Ambulatory Visit | Attending: Obstetrics and Gynecology | Admitting: Obstetrics and Gynecology

## 2019-03-26 DIAGNOSIS — Z01812 Encounter for preprocedural laboratory examination: Secondary | ICD-10-CM | POA: Diagnosis not present

## 2019-03-26 DIAGNOSIS — N83201 Unspecified ovarian cyst, right side: Secondary | ICD-10-CM | POA: Diagnosis not present

## 2019-03-26 DIAGNOSIS — Z20828 Contact with and (suspected) exposure to other viral communicable diseases: Secondary | ICD-10-CM | POA: Insufficient documentation

## 2019-03-26 LAB — CBC WITH DIFFERENTIAL/PLATELET
Abs Immature Granulocytes: 0.04 10*3/uL (ref 0.00–0.07)
Basophils Absolute: 0.1 10*3/uL (ref 0.0–0.1)
Basophils Relative: 1 %
Eosinophils Absolute: 0.3 10*3/uL (ref 0.0–0.5)
Eosinophils Relative: 3 %
HCT: 41 % (ref 36.0–46.0)
Hemoglobin: 13.5 g/dL (ref 12.0–15.0)
Immature Granulocytes: 1 %
Lymphocytes Relative: 44 %
Lymphs Abs: 3.8 10*3/uL (ref 0.7–4.0)
MCH: 29.5 pg (ref 26.0–34.0)
MCHC: 32.9 g/dL (ref 30.0–36.0)
MCV: 89.5 fL (ref 80.0–100.0)
Monocytes Absolute: 0.6 10*3/uL (ref 0.1–1.0)
Monocytes Relative: 6 %
Neutro Abs: 3.9 10*3/uL (ref 1.7–7.7)
Neutrophils Relative %: 45 %
Platelets: 330 10*3/uL (ref 150–400)
RBC: 4.58 MIL/uL (ref 3.87–5.11)
RDW: 13.2 % (ref 11.5–15.5)
WBC: 8.7 10*3/uL (ref 4.0–10.5)
nRBC: 0 % (ref 0.0–0.2)

## 2019-03-26 LAB — COMPREHENSIVE METABOLIC PANEL
ALT: 19 U/L (ref 0–44)
AST: 16 U/L (ref 15–41)
Albumin: 4.1 g/dL (ref 3.5–5.0)
Alkaline Phosphatase: 65 U/L (ref 38–126)
Anion gap: 8 (ref 5–15)
BUN: 8 mg/dL (ref 6–20)
CO2: 26 mmol/L (ref 22–32)
Calcium: 8.6 mg/dL — ABNORMAL LOW (ref 8.9–10.3)
Chloride: 105 mmol/L (ref 98–111)
Creatinine, Ser: 0.72 mg/dL (ref 0.44–1.00)
GFR calc Af Amer: >60 mL/min (ref >60)
GFR calc non Af Amer: >60 mL/min (ref >60)
Glucose, Bld: 84 mg/dL (ref 70–99)
Potassium: 3.6 mmol/L (ref 3.5–5.1)
Sodium: 139 mmol/L (ref 135–145)
Total Bilirubin: 0.8 mg/dL (ref 0.3–1.2)
Total Protein: 6.9 g/dL (ref 6.5–8.1)

## 2019-03-26 LAB — TYPE AND SCREEN
ABO/RH(D): O POS
Antibody Screen: NEGATIVE

## 2019-03-26 LAB — SARS CORONAVIRUS 2 (TAT 6-24 HRS): SARS Coronavirus 2: NEGATIVE

## 2019-03-26 LAB — HCG, SERUM, QUALITATIVE: Preg, Serum: NEGATIVE

## 2019-03-30 ENCOUNTER — Encounter (HOSPITAL_COMMUNITY)
Admission: RE | Disposition: A | Discharge status: Home / Self Care | Payer: Self-pay | Source: Home / Self Care | Attending: Obstetrics and Gynecology

## 2019-03-30 ENCOUNTER — Other Ambulatory Visit: Payer: Self-pay

## 2019-03-30 ENCOUNTER — Ambulatory Visit (HOSPITAL_COMMUNITY): Payer: Medicaid Other | Admitting: Anesthesiology

## 2019-03-30 ENCOUNTER — Observation Stay (HOSPITAL_COMMUNITY)
Admission: RE | Admit: 2019-03-30 | Discharge: 2019-03-30 | Disposition: A | Discharge status: Home / Self Care | Payer: Medicaid Other | Attending: Obstetrics and Gynecology | Admitting: Obstetrics and Gynecology

## 2019-03-30 ENCOUNTER — Encounter (HOSPITAL_COMMUNITY): Payer: Self-pay | Admitting: Anesthesiology

## 2019-03-30 DIAGNOSIS — R569 Unspecified convulsions: Secondary | ICD-10-CM | POA: Diagnosis not present

## 2019-03-30 DIAGNOSIS — G2581 Restless legs syndrome: Secondary | ICD-10-CM | POA: Insufficient documentation

## 2019-03-30 DIAGNOSIS — Z793 Long term (current) use of hormonal contraceptives: Secondary | ICD-10-CM | POA: Diagnosis not present

## 2019-03-30 DIAGNOSIS — Z79899 Other long term (current) drug therapy: Secondary | ICD-10-CM | POA: Diagnosis not present

## 2019-03-30 DIAGNOSIS — D270 Benign neoplasm of right ovary: Secondary | ICD-10-CM | POA: Diagnosis not present

## 2019-03-30 DIAGNOSIS — K219 Gastro-esophageal reflux disease without esophagitis: Secondary | ICD-10-CM | POA: Diagnosis not present

## 2019-03-30 DIAGNOSIS — N83201 Unspecified ovarian cyst, right side: Secondary | ICD-10-CM | POA: Diagnosis present

## 2019-03-30 HISTORY — PX: LAPAROSCOPIC OVARIAN CYSTECTOMY: SHX6248

## 2019-03-30 SURGERY — EXCISION, CYST, OVARY, LAPAROSCOPIC
Anesthesia: General | Laterality: Right

## 2019-03-30 MED ORDER — HYDROCODONE-ACETAMINOPHEN 5-325 MG PO TABS: 1.0000 | ORAL_TABLET | Freq: Four times a day (QID) | ORAL | 0 refills | Status: DC | PRN

## 2019-03-30 MED ORDER — ONDANSETRON HCL 4 MG/2ML IJ SOLN
INTRAMUSCULAR | Status: DC | PRN
  Administered 2019-03-30: 4 mg via INTRAVENOUS

## 2019-03-30 MED ORDER — PROPOFOL 10 MG/ML IV BOLUS
INTRAVENOUS | Status: DC | PRN
  Administered 2019-03-30: 20 mg via INTRAVENOUS
  Administered 2019-03-30: 180 mg via INTRAVENOUS
  Administered 2019-03-30: 20 mg via INTRAVENOUS

## 2019-03-30 MED ORDER — BUPIVACAINE-EPINEPHRINE (PF) 0.5% -1:200000 IJ SOLN
INTRAMUSCULAR | Status: DC | PRN
  Administered 2019-03-30: 19 mL

## 2019-03-30 MED ORDER — HYDROCODONE-ACETAMINOPHEN 7.5-325 MG PO TABS: 1.0000 | ORAL_TABLET | Freq: Once | ORAL | Status: DC | PRN

## 2019-03-30 MED ORDER — FENTANYL CITRATE (PF) 250 MCG/5ML IJ SOLN
INTRAMUSCULAR | Status: AC
  Filled 2019-03-30: qty 5

## 2019-03-30 MED ORDER — LACTATED RINGERS IV SOLN
INTRAVENOUS | Status: DC
  Administered 2019-03-30 (×2): via INTRAVENOUS

## 2019-03-30 MED ORDER — LIDOCAINE HCL (CARDIAC) PF 100 MG/5ML IV SOSY
PREFILLED_SYRINGE | INTRAVENOUS | Status: DC | PRN
  Administered 2019-03-30: 60 mg via INTRAVENOUS

## 2019-03-30 MED ORDER — PROPOFOL 10 MG/ML IV BOLUS
INTRAVENOUS | Status: AC
  Filled 2019-03-30: qty 20

## 2019-03-30 MED ORDER — GLYCOPYRROLATE PF 0.2 MG/ML IJ SOSY
PREFILLED_SYRINGE | INTRAMUSCULAR | Status: AC
  Filled 2019-03-30: qty 1

## 2019-03-30 MED ORDER — ONDANSETRON HCL 4 MG/2ML IJ SOLN
4.0000 mg | Freq: Once | INTRAMUSCULAR | Status: AC
  Administered 2019-03-30: 15:00:00 4 mg via INTRAVENOUS

## 2019-03-30 MED ORDER — SODIUM CHLORIDE 0.9 % IR SOLN
Status: DC | PRN
  Administered 2019-03-30: 3000 mL

## 2019-03-30 MED ORDER — MIDAZOLAM HCL 2 MG/2ML IJ SOLN: 0.5000 mg | Freq: Once | INTRAMUSCULAR | Status: DC | PRN

## 2019-03-30 MED ORDER — MIDAZOLAM HCL 2 MG/2ML IJ SOLN
INTRAMUSCULAR | Status: AC
  Filled 2019-03-30: qty 2

## 2019-03-30 MED ORDER — BUPIVACAINE-EPINEPHRINE (PF) 0.5% -1:200000 IJ SOLN
INTRAMUSCULAR | Status: AC
  Filled 2019-03-30: qty 30

## 2019-03-30 MED ORDER — FENTANYL CITRATE (PF) 100 MCG/2ML IJ SOLN
INTRAMUSCULAR | Status: DC | PRN
  Administered 2019-03-30: 50 ug via INTRAVENOUS
  Administered 2019-03-30: 25 ug via INTRAVENOUS
  Administered 2019-03-30: 50 ug via INTRAVENOUS
  Administered 2019-03-30: 25 ug via INTRAVENOUS
  Administered 2019-03-30: 50 ug via INTRAVENOUS

## 2019-03-30 MED ORDER — MIDAZOLAM HCL 5 MG/5ML IJ SOLN
INTRAMUSCULAR | Status: DC | PRN
  Administered 2019-03-30: 2 mg via INTRAVENOUS

## 2019-03-30 MED ORDER — ONDANSETRON HCL 4 MG/2ML IJ SOLN
INTRAMUSCULAR | Status: AC
  Filled 2019-03-30: qty 2

## 2019-03-30 MED ORDER — CEFAZOLIN SODIUM-DEXTROSE 2-4 GM/100ML-% IV SOLN
2.0000 g | Freq: Once | INTRAVENOUS | Status: AC
  Administered 2019-03-30: 12:00:00 2 g via INTRAVENOUS

## 2019-03-30 MED ORDER — CEFAZOLIN SODIUM-DEXTROSE 2-4 GM/100ML-% IV SOLN
INTRAVENOUS | Status: AC
  Filled 2019-03-30: qty 100

## 2019-03-30 MED ORDER — PROMETHAZINE HCL 25 MG/ML IJ SOLN: 6.2500 mg | INTRAMUSCULAR | Status: DC | PRN

## 2019-03-30 MED ORDER — SUCCINYLCHOLINE CHLORIDE 20 MG/ML IJ SOLN
INTRAMUSCULAR | Status: DC | PRN
  Administered 2019-03-30: 140 mg via INTRAVENOUS

## 2019-03-30 MED ORDER — BUPIVACAINE HCL (PF) 0.5 % IJ SOLN
INTRAMUSCULAR | Status: AC
  Filled 2019-03-30: qty 30

## 2019-03-30 MED ORDER — 0.9 % SODIUM CHLORIDE (POUR BTL) OPTIME
TOPICAL | Status: DC | PRN
  Administered 2019-03-30: 12:00:00 1000 mL

## 2019-03-30 MED ORDER — SUGAMMADEX SODIUM 200 MG/2ML IV SOLN
INTRAVENOUS | Status: DC | PRN
  Administered 2019-03-30: 200 mg via INTRAVENOUS

## 2019-03-30 MED ORDER — ROCURONIUM BROMIDE 100 MG/10ML IV SOLN
INTRAVENOUS | Status: DC | PRN
  Administered 2019-03-30: 30 mg via INTRAVENOUS
  Administered 2019-03-30 (×2): 10 mg via INTRAVENOUS

## 2019-03-30 MED ORDER — DEXAMETHASONE SODIUM PHOSPHATE 10 MG/ML IJ SOLN
INTRAMUSCULAR | Status: DC | PRN
  Administered 2019-03-30: 8 mg via INTRAVENOUS

## 2019-03-30 MED ORDER — HYDROMORPHONE HCL 1 MG/ML IJ SOLN
0.2500 mg | INTRAMUSCULAR | Status: DC | PRN
  Administered 2019-03-30: 0.5 mg via INTRAVENOUS
  Filled 2019-03-30: qty 0.5

## 2019-03-30 MED ORDER — GLYCOPYRROLATE PF 0.2 MG/ML IJ SOSY
PREFILLED_SYRINGE | INTRAMUSCULAR | Status: DC | PRN
  Administered 2019-03-30: .1 mg via INTRAVENOUS

## 2019-03-30 SURGICAL SUPPLY — 54 items
BAG RETRIEVAL 10MM (BASKET) ×1
BANDAGE STRIP 1X3 FLEXIBLE (GAUZE/BANDAGES/DRESSINGS) ×12 IMPLANT
BENZOIN TINCTURE PRP APPL 2/3 (GAUZE/BANDAGES/DRESSINGS) ×3 IMPLANT
BLADE SURG SZ11 CARB STEEL (BLADE) ×3 IMPLANT
BNDG ADH 1X3 FABRIC TAN LF (GAUZE/BANDAGES/DRESSINGS) ×9 IMPLANT
CLOSURE WOUND 1/4 X3 (GAUZE/BANDAGES/DRESSINGS) ×1
CLOTH BEACON ORANGE TIMEOUT ST (SAFETY) ×3 IMPLANT
COVER LIGHT HANDLE STERIS (MISCELLANEOUS) ×6 IMPLANT
COVER WAND RF STERILE (DRAPES) ×3 IMPLANT
DECANTER SPIKE VIAL GLASS SM (MISCELLANEOUS) ×3 IMPLANT
DISSECTOR BLUNT TIP ENDO 5MM (MISCELLANEOUS) ×3 IMPLANT
DURAPREP 26ML APPLICATOR (WOUND CARE) ×3 IMPLANT
ELECT REM PT RETURN 9FT ADLT (ELECTROSURGICAL) ×3
ELECTRODE REM PT RTRN 9FT ADLT (ELECTROSURGICAL) ×1 IMPLANT
FILTER SMOKE EVAC LAPAROSHD (FILTER) ×3 IMPLANT
GLOVE BIOGEL PI IND STRL 7.0 (GLOVE) ×4 IMPLANT
GLOVE BIOGEL PI IND STRL 9 (GLOVE) ×1 IMPLANT
GLOVE BIOGEL PI INDICATOR 7.0 (GLOVE) ×8
GLOVE BIOGEL PI INDICATOR 9 (GLOVE) ×2
GLOVE ECLIPSE 6.5 STRL STRAW (GLOVE) ×3 IMPLANT
GLOVE ECLIPSE 9.0 STRL (GLOVE) ×6 IMPLANT
GOWN SPEC L3 XXLG W/TWL (GOWN DISPOSABLE) ×3 IMPLANT
GOWN STRL REUS W/TWL LRG LVL3 (GOWN DISPOSABLE) ×3 IMPLANT
INST SET LAPROSCOPIC GYN AP (KITS) ×3 IMPLANT
IV NS IRRIG 3000ML ARTHROMATIC (IV SOLUTION) ×3 IMPLANT
KIT TURNOVER CYSTO (KITS) ×3 IMPLANT
MANIFOLD NEPTUNE II (INSTRUMENTS) ×3 IMPLANT
NEEDLE INSUFFLATION 14GA 120MM (NEEDLE) ×3 IMPLANT
NEEDLE SPNL 18GX3.5 QUINCKE PK (NEEDLE) ×3 IMPLANT
PACK PERI GYN (CUSTOM PROCEDURE TRAY) ×3 IMPLANT
PAD ARMBOARD 7.5X6 YLW CONV (MISCELLANEOUS) ×3 IMPLANT
SET BASIN LINEN APH (SET/KITS/TRAYS/PACK) ×3 IMPLANT
SET TUBE IRRIG SUCTION NO TIP (IRRIGATION / IRRIGATOR) ×3 IMPLANT
SET TUBE SMOKE EVAC HIGH FLOW (TUBING) ×3 IMPLANT
SHEARS HARMONIC ACE PLUS 36CM (ENDOMECHANICALS) ×3 IMPLANT
SLEEVE ENDOPATH XCEL 5M (ENDOMECHANICALS) ×3 IMPLANT
SOL ANTI FOG 6CC (MISCELLANEOUS) ×1 IMPLANT
SOLUTION ANTI FOG 6CC (MISCELLANEOUS) ×2
STRIP CLOSURE SKIN 1/4X3 (GAUZE/BANDAGES/DRESSINGS) ×2 IMPLANT
SUT VIC AB 3-0 SH 27 (SUTURE) ×2
SUT VIC AB 3-0 SH 27X BRD (SUTURE) ×1 IMPLANT
SUT VIC AB 4-0 PS2 27 (SUTURE) ×6 IMPLANT
SUT VICRYL 0 UR6 27IN ABS (SUTURE) ×3 IMPLANT
SYR 10ML LL (SYRINGE) ×3 IMPLANT
SYR BULB IRRIGATION 50ML (SYRINGE) ×3 IMPLANT
SYR CONTROL 10ML LL (SYRINGE) ×3 IMPLANT
SYS BAG RETRIEVAL 10MM (BASKET) ×2
SYSTEM BAG RETRIEVAL 10MM (BASKET) ×1 IMPLANT
TRAY FOLEY W/BAG SLVR 16FR (SET/KITS/TRAYS/PACK) ×2
TRAY FOLEY W/BAG SLVR 16FR ST (SET/KITS/TRAYS/PACK) ×1 IMPLANT
TROCAR ENDO BLADELESS 11MM (ENDOMECHANICALS) ×3 IMPLANT
TROCAR ENDO BLADELESS 12MM (ENDOMECHANICALS) ×3 IMPLANT
TROCAR XCEL NON-BLD 5MMX100MML (ENDOMECHANICALS) ×6 IMPLANT
WARMER LAPAROSCOPE (MISCELLANEOUS) ×3 IMPLANT

## 2019-03-30 NOTE — Anesthesia Procedure Notes (Signed)
Procedure Name: Intubation Date/Time: 03/30/2019 11:44 AM Performed by: Andree Elk, Amy A, CRNA Pre-anesthesia Checklist: Patient identified, Patient being monitored, Timeout performed, Emergency Drugs available and Suction available Patient Re-evaluated:Patient Re-evaluated prior to induction Oxygen Delivery Method: Circle system utilized Preoxygenation: Pre-oxygenation with 100% oxygen Induction Type: IV induction Ventilation: Mask ventilation without difficulty Laryngoscope Size: Mac and 3 Grade View: Grade I Tube type: Oral Tube size: 7.0 mm Number of attempts: 1 Airway Equipment and Method: Stylet Placement Confirmation: ETT inserted through vocal cords under direct vision,  positive ETCO2 and breath sounds checked- equal and bilateral Secured at: 21 cm Tube secured with: Tape Dental Injury: Teeth and Oropharynx as per pre-operative assessment

## 2019-03-30 NOTE — Discharge Instructions (Signed)
Ovarian Cystectomy, Care After °This sheet gives you information about how to care for yourself after your procedure. Your health care provider may also give you more specific instructions. If you have problems or questions, contact your health care provider. °What can I expect after the procedure? °After the procedure, it is common to have: °· Pain in your abdomen, especially at the incision areas. You will be given pain medicines to control the pain. °· Tiredness. This is a normal part of the recovery process. Your energy level will return to normal over the next several weeks. °· Problems passing stool (constipation). °Follow these instructions at home: °Medicines °· Take over-the-counter and prescription medicines only as told by your health care provider. °· If you were prescribed an antibiotic medicine, use it as told by your health care provider. Do not stop using the antibiotic even if you start to feel better. °· Do not take aspirin because it can cause bleeding. °· Do not drink alcohol while taking prescription pain medicine. °· Do not drive or use heavy machinery while taking prescription pain medicine. °Incision care ° °· Follow instructions from your health care provider about how to take care of your incisions. Make sure you: °? Wash your hands with soap and water before you change your bandage (dressing). If soap and water are not available, use hand sanitizer. °? Change your dressing as told by your health care provider. °? Leave stitches (sutures), skin glue, or adhesive strips in place. These skin closures may need to stay in place for 2 weeks or longer. If adhesive strip edges start to loosen and curl up, you may trim the loose edges. Do not remove adhesive strips completely unless your health care provider tells you to do that. °· Check your incision areas every day for signs of infection. Check for: °? Redness, swelling, or pain. °? Fluid or blood. °? Warmth. °? Pus or a bad smell. °· Do not  take baths, swim, or use a hot tub until your health care provider approves. Take showers instead of baths. °Activity °· Return to your normal activities and diet as told by your health care provider. Ask your health care provider what activities are safe for you. °· Take rest breaks during the day as needed. °· Do not drive until your health care provider approves. °General instructions °· Do not douche, use tampons, or have sexual intercourse until your health care provider says it is okay to do so. °· To prevent or treat constipation while you are taking prescription pain medicine, your health care provider may recommend that you: °? Take over-the-counter or prescription medicines. °? Eat foods that are high in fiber, such as fresh fruits and vegetables, whole grains, and beans. °? Drink enough fluid to keep your urine clear or pale yellow. °? Limit foods that are high in fat and processed sugars, such as fried and sweet foods. °· Keep all follow-up visits as told by your health care provider. This is important. °Contact a health care provider if: °· You have a fever. °· You feel nauseous or you vomit. °· You have pain when you urinate or have blood in your urine. °· You have a rash on your body. °· You have pain or redness where the IV was inserted. °· You have pain that is not relieved with medicine. °· You have signs of infection, such as: °? Redness, swelling, or pain around your incisions. °? Fluid or blood coming from your incisions. °? An incision that   feels warm to the touch. ? Pus or a bad smell coming from your incisions. Get help right away if:  You have chest pain or shortness of breath.  You feel dizzy or light-headed.  You have increasing abdominal pain that is not relieved with medicines.  You have pain, swelling, or redness in your leg.  Your incision is opening (the edges are not staying together). Summary  After the procedure, it is common to have some pain in your abdomen. You  will be given pain medicines to control the pain.  Follow instructions from your health care provider about how to take care of your incisions.  Do not douche, use tampons, or have sexual intercourse until your health care provider says it is okay to do so.  Keep all follow-up visits as told by your health care provider. This is important. This information is not intended to replace advice given to you by your health care provider. Make sure you discuss any questions you have with your health care provider. Document Released: 02/24/2013 Document Revised: 04/18/2017 Document Reviewed: 06/25/2016 Elsevier Patient Education  2020 [employer]. Diagnostic Laparoscopy Diagnostic laparoscopy is a procedure to diagnose diseases in the abdomen. It might be done for a variety of reasons, such as to look for scar tissue, cancer, or a reason for abdomen (abdominal) pain. During the procedure, a thin, flexible tube that has a light and a camera on the end (laparoscope) is inserted through an incision in the abdomen. The image from the camera is shown on a monitor to help your surgeon see inside your body. Tell a health care provider about:  Any allergies you have.  All medicines you are taking, including vitamins, herbs, eye drops, creams, and over-the-counter medicines.  Any problems you or family members have had with anesthetic medicines.  Any blood disorders you have.  Any surgeries you have had.  Any medical conditions you have. What are the risks? Generally, this is a safe procedure. However, problems may occur, including:  Infection.  Bleeding.  Allergic reactions to medicines or dyes.  Damage to abdominal structures or organs, such as the intestines, liver, stomach, or spleen. What happens before the procedure? Medicines  Ask your health care provider about: ? Changing or stopping your regular medicines. This is especially important if you are taking diabetes medicines or blood  thinners. ? Taking medicines such as aspirin and ibuprofen. These medicines can thin your blood. Do not take these medicines unless your health care provider tells you to take them. ? Taking over-the-counter medicines, vitamins, herbs, and supplements.  You may be given antibiotic medicine to help prevent infection. Staying hydrated Follow instructions from your health care provider about hydration, which may include:  Up to 2 hours before the procedure - you may continue to drink clear liquids, such as water, clear fruit juice, black coffee, and plain tea. Eating and drinking restrictions Follow instructions from your health care provider about eating and drinking, which may include:  8 hours before the procedure - stop eating heavy meals or foods such as meat, fried foods, or fatty foods.  6 hours before the procedure - stop eating light meals or foods, such as toast or cereal.  6 hours before the procedure - stop drinking milk or drinks that contain milk.  2 hours before the procedure - stop drinking clear liquids. General instructions  Ask your health care provider how your surgical site will be marked or identified.  You may be asked to shower  with a germ-killing soap.  Plan to have someone take you home from the hospital or clinic.  Plan to have a responsible adult care for you for at least 24 hours after you leave the hospital or clinic. This is important. What happens during the procedure?   To lower your risk of infection: ? Your health care team will wash or sanitize their hands. ? Hair may be removed from the surgical area. ? Your skin will be washed with soap.  An IV will be inserted into one of your veins.  You will be given a medicine to make you fall asleep (general anesthetic). You may also be given a medicine to help you relax (sedative).  A breathing tube will be placed down your throat to help you breathe during the procedure.  Your abdomen will be filled  with an air-like gas so it expands. This will give the surgeon more room to operate and will make your organs easier to see.  Many small incisions will be made in your abdomen.  A laparoscope and other surgical instruments will be inserted into your abdomen through the incisions.  A tissue sample may be removed from an organ for examination (biopsy). This will depend on the reason why you are having this procedure.  The laparoscope and other instruments will be removed from your abdomen.  The gas will be released.  Your incisions will be closed with stitches (sutures) and covered with a bandage (dressing).  Your breathing tube will be removed. The procedure may vary among health care providers and hospitals. What happens after the procedure?   Your blood pressure, heart rate, breathing rate, and blood oxygen level will be monitored until the medicines you were given have worn off.  Do not drive for 24 hours if you were given a sedative during your procedure.  It is up to you to get the results of your procedure. Ask your health care provider, or the department that is doing the procedure, when your results will be ready. Summary  Diagnostic laparoscopy is a way to look for problems in the abdomen using small incisions.  Follow instructions from your health care provider about how to prepare for the procedure.  Plan to have a responsible adult care for you for at least 24 hours after you leave the hospital or clinic. This is important. This information is not intended to replace advice given to you by your health care provider. Make sure you discuss any questions you have with your health care provider. Document Released: 08/12/2000 Document Revised: 04/18/2017 Document Reviewed: 10/30/2016 Elsevier Patient Education  2020 [employer].

## 2019-03-30 NOTE — Anesthesia Preprocedure Evaluation (Signed)
Anesthesia Evaluation  Patient identified by MRN, date of birth, ID band Patient awake    Reviewed: Allergy & Precautions, NPO status , Patient's Chart, lab work & pertinent test results  Airway Mallampati: II  TM Distance: >3 FB Neck ROM: Full    Dental no notable dental hx. (+) Teeth Intact   Pulmonary neg pulmonary ROS,    Pulmonary exam normal breath sounds clear to auscultation       Cardiovascular Exercise Tolerance: Good negative cardio ROS Normal cardiovascular examI Rhythm:Regular Rate:Normal     Neuro/Psych Seizures -, Poorly Controlled,  Depression States on Keppra for ~2-3 weeks  States last Sz was 2 days ago- states Grand mal Gives h/o CHI in 10/2018- hit head on a door frame and knocked herself out - has had w/u. negative psych ROS   GI/Hepatic Neg liver ROS, GERD  Controlled and Medicated,  Endo/Other  negative endocrine ROS  Renal/GU negative Renal ROS  negative genitourinary   Musculoskeletal negative musculoskeletal ROS (+)   Abdominal   Peds negative pediatric ROS (+)  Hematology negative hematology ROS (+)   Anesthesia Other Findings   Reproductive/Obstetrics negative OB ROS                             Anesthesia Physical Anesthesia Plan  ASA: II  Anesthesia Plan: General   Post-op Pain Management:    Induction: Intravenous  PONV Risk Score and Plan: 3 and Midazolam, Ondansetron, Treatment may vary due to age or medical condition and Dexamethasone  Airway Management Planned: Oral ETT  Additional Equipment:   Intra-op Plan:   Post-operative Plan: Extubation in OR  Informed Consent: I have reviewed the patients History and Physical, chart, labs and discussed the procedure including the risks, benefits and alternatives for the proposed anesthesia with the patient or authorized representative who has indicated his/her understanding and acceptance.      Dental advisory given  Plan Discussed with: CRNA  Anesthesia Plan Comments: (Plan Full PPE use Plan GETA D/W PT -WTP with same after Q&A)        Anesthesia Quick Evaluation

## 2019-03-30 NOTE — Interval H&P Note (Signed)
History and Physical Interval Note:  03/30/2019 11:31 AM  Bianca Singleton  has presented today for surgery, with the diagnosis of Right Ov cyst.  The various methods of treatment have been discussed with the patient and family. After consideration of risks, benefits and other options for treatment, the patient has consented to  Procedure(s): LAPAROSCOPIC OVARIAN CYSTECTOMY (Right) as a surgical intervention.  The patient's history has been reviewed, patient examined, no change in status, stable for surgery.  I have reviewed the patient's chart and labs.  Questions were answered to the patient's satisfaction.   She reports that she took her KEPPRA this morning. She did have a seizure a couple of days ago, Neurology is aware and have not changed any medications. Dr Hilaria Ota aware.  Jonnie Kind

## 2019-03-30 NOTE — Transfer of Care (Signed)
Immediate Anesthesia Transfer of Care Note  Patient: Bianca Singleton  Procedure(s) Performed: LAPAROSCOPIC OVARIAN CYSTECTOMY (Right )  Patient Location: PACU  Anesthesia Type:General  Level of Consciousness: awake, alert , oriented and patient cooperative  Airway & Oxygen Therapy: Patient Spontanous Breathing and Patient connected to face mask oxygen  Post-op Assessment: Report given to RN and Post -op Vital signs reviewed and stable  Post vital signs: Reviewed and stable  Last Vitals:  Vitals Value Taken Time  BP 111/55 03/30/19 1346  Temp    Pulse 91 03/30/19 1347  Resp 20 03/30/19 1347  SpO2 100 % 03/30/19 1347  Vitals shown include unvalidated device data.  Last Pain:  Vitals:   03/30/19 1106  TempSrc: Oral      Patients Stated Pain Goal: 8 ([id number])  Complications: No apparent anesthesia complications

## 2019-03-30 NOTE — H&P (Signed)
Preoperative History and Physical  Bianca Singleton is a 23 y.o. G1P0010 here for surgical management of laparoscopic right ovarian cystectomy for persistently symptomatic right ovarian cyst, with benign characteristics.   No significant preoperative concerns.  Proposed surgery: Laparoscopic right ovarian cystectomy  Past Medical History:  Diagnosis Date  . ADHD (attention deficit hyperactivity disorder)   . Patella-femoral syndrome   . Restless legs   . Seizures ([place])    Past Surgical History:  Procedure Laterality Date  . UPPER GI ENDOSCOPY    . WISDOM TOOTH EXTRACTION     OB History  Gravida Para Term Preterm AB Living  1       1    SAB TAB Ectopic Multiple Live Births  1            # Outcome Date GA Lbr Len/2nd Weight Sex Delivery Anes PTL Lv  1 SAB           Patient denies any other pertinent gynecologic issues.   No current facility-administered medications on file prior to encounter.    Current Outpatient Medications on File Prior to Encounter  Medication Sig Dispense Refill  . norgestimate-ethinyl estradiol (ORTHO-CYCLEN,SPRINTEC,PREVIFEM) 0.25-35 MG-MCG tablet Take 1 tablet by mouth daily. 1 Package 11  . pramipexole (MIRAPEX) 0.5 MG tablet Take 0.5 mg by mouth 3 (three) times daily.    . promethazine (PHENERGAN) 25 MG tablet Take 1 tablet (25 mg total) by mouth every 6 (six) hours as needed for nausea or vomiting. 30 tablet 1  . traZODone (DESYREL) 50 MG tablet 25 mg at bedtime.      No Known Allergies  Social History:   reports that she has never smoked. She has never used smokeless tobacco. She reports that she does not drink alcohol or use drugs.  Family History  Problem Relation Age of Onset  . Lung cancer Paternal Grandfather   . Breast cancer Paternal Grandmother     Review of Systems: Noncontributory  PHYSICAL EXAM:   General appearance - alert, well appearing, and in no distress Chest - clear to auscultation, no wheezes, rales or rhonchi, symmetric  air entry Heart - normal rate and regular rhythm Abdomen - soft, nontender, nondistended, no masses or organomegaly                     No surgical scars Pelvic - examination PELVIC External genitalia -normal Vulva -normal Vagina -generous clear physiologic appearing discharge Cervix -tiny mobile nontender  Uterus -anteflexed nontender normal size Adnexa -nontender left adnexa, right adnexa has the enlarged cystic ovary at the pelvic brim which is tender to palpation causing the patient to grimace on bimanual exam but not to guard or or change position [employer] -  Rectal -  , rectal exam not indicated   Extremities - peripheral pulses normal, no pedal edema, no clubbing or cyanosis  Labs: Results for orders placed or performed during the hospital encounter of 03/26/19 (from the past 336 hour(s))  CBC WITH DIFFERENTIAL   Collection Time: 03/26/19  1:23 PM  Result Value Ref Range   WBC 8.7 4.0 - 10.5 K/uL   RBC 4.58 3.87 - 5.11 MIL/uL   Hemoglobin 13.5 12.0 - 15.0 g/dL   HCT 41.0 36.0 - 46.0 %   MCV 89.5 80.0 - 100.0 fL   MCH 29.5 26.0 - 34.0 pg   MCHC 32.9 30.0 - 36.0 g/dL   RDW 13.2 11.5 - 15.5 %   Platelets 330 150 - 400 K/uL  nRBC 0.0 0.0 - 0.2 %   Neutrophils Relative % 45 %   Neutro Abs 3.9 1.7 - 7.7 K/uL   Lymphocytes Relative 44 %   Lymphs Abs 3.8 0.7 - 4.0 K/uL   Monocytes Relative 6 %   Monocytes Absolute 0.6 0.1 - 1.0 K/uL   Eosinophils Relative 3 %   Eosinophils Absolute 0.3 0.0 - 0.5 K/uL   Basophils Relative 1 %   Basophils Absolute 0.1 0.0 - 0.1 K/uL   Immature Granulocytes 1 %   Abs Immature Granulocytes 0.04 0.00 - 0.07 K/uL  Comprehensive metabolic panel   Collection Time: 03/26/19  1:23 PM  Result Value Ref Range   Sodium 139 135 - 145 mmol/L   Potassium 3.6 3.5 - 5.1 mmol/L   Chloride 105 98 - 111 mmol/L   CO2 26 22 - 32 mmol/L   Glucose, Bld 84 70 - 99 mg/dL   BUN 8 6 - 20 mg/dL   Creatinine, Ser 0.72 0.44 - 1.00 mg/dL   Calcium 8.6 (L) 8.9  - 10.3 mg/dL   Total Protein 6.9 6.5 - 8.1 g/dL   Albumin 4.1 3.5 - 5.0 g/dL   AST 16 15 - 41 U/L   ALT 19 0 - 44 U/L   Alkaline Phosphatase 65 38 - 126 U/L   Total Bilirubin 0.8 0.3 - 1.2 mg/dL   GFR calc non Af Amer >60 >60 mL/min   GFR calc Af Amer >60 >60 mL/min   Anion gap 8 5 - 15  hCG, serum, qualitative   Collection Time: 03/26/19  1:23 PM  Result Value Ref Range   Preg, Serum NEGATIVE NEGATIVE  Type and screen   Collection Time: 03/26/19  1:23 PM  Result Value Ref Range   ABO/RH(D) O POS    Antibody Screen NEG    Sample Expiration 04/09/2019,2359    Extend sample reason      NO TRANSFUSIONS OR PREGNANCY IN THE PAST 3 MONTHS Performed at [facility], [address] [place], [place] [address]   Results for orders placed or performed during the hospital encounter of 03/26/19 (from the past 336 hour(s))  SARS CORONAVIRUS 2 (TAT 6-24 HRS) Nasopharyngeal Nasopharyngeal Swab   Collection Time: 03/26/19  7:40 AM   Specimen: Nasopharyngeal Swab  Result Value Ref Range   SARS Coronavirus 2 NEGATIVE NEGATIVE    Imaging Studies: GYNECOLOGIC SONOGRAM   Bianca Singleton is a 23 y.o. G1P0010 LMP 12/22/2018 she is here for a f/u pelvic sonogram for right ovarian cyst .  Uterus                      6.5 x 4.1 x 5.6 cm, Total uterine volume 79 cc, homogeneous anteverted uterus,wnl  Endometrium          6.8 mm, symmetrical, wnl  Right ovary             5.4 x 4.9 x 6.3 cm, two simple cysts vs large cyst w/a thin septation,(#1) 4.1 x 3.2 x 4.9 cm,(#2) 3.9 x 3 x 4.7 cm with arterial and venous flow  Left ovary                4.4 x 1.9 x 2.4 cm, wnl  No free fluid   Technician Comments:  PELVIC US TA/TV: homogeneous anteverted uterus,wnl,EEC 6.8 cm,normal left ovary,two simple cysts vs large cyst w/a thin septation,(#1) 4.1 x 3.2 x 4.9 cm,(#2) 3.9 x 3 x 4.7 cm with arterial and  venous flow,no free fluid,pelvic pain during ultrasound,ovaries appear mobile     [facility] 12/28/2018 9:49 AM  Clinical Impression and recommendations:  I have reviewed the sonogram results above.,  Combined with the patient's current clinical course, below are my impressions and any appropriate recommendations for management based on the sonographic findings:   FINDINGS:  I                             Maternal uterus/adnexae: Anteverted uterus with symmetric thin uniform endometrial stripe 7 mm thickness                                                                       Ovaries are same, left ovary is within normal limits.                                                                       Right ovary ABNORMAL, with A large a large cystic component, 6.2 x 4.9 x 3.8 cm with a thin single septation less than 1 mm thick, no internal solid components or hypervascularity.  There is a thin layer normally appearing ovarian tissue outside the cystic component, 85 cc total ovarian volume                                                                       Left ovary    10.6 cc ovarian volume, normal                                                                       Free Fluid none   IMPRESSION: Persistent symptomatic cystic right ovary, without suppression on OCPs, benign characteristics Findings reviewed with patient who desires removal of ovarian cyst and desires attempted preservation right ovarian normal-appearing tissues  Jonnie Kind 12/28/2018  Assessment: Patient Active Problem List   Diagnosis Date Noted  . Intermittent generalized abdominal pain 07/01/2018  . Trichomonas infection 06/15/2018    Plan: Patient will undergo surgical management with laparoscopic right ovarian cystectomy.  The potential for difficulty salvaging right ovarian normal tissue has been discussed with the patient.  The possibility that right oophorectomy or right salpingo-oophorectomy may be necessary for surgical removal of the cyst has been discussed.  Patient is aware that  all reasonable efforts to salvage tissue of the right ovary will be performed. Pathology evaluation of the tissue will be performed as suspected benign characteristics cannot be fully  determined until tissue evaluation completed.  Patient is aware of this. Jonnie Kind, MD   03/30/2019 7:29 AM

## 2019-03-30 NOTE — Brief Op Note (Signed)
03/30/2019  1:37 PM  PATIENT:  Bianca Singleton  23 y.o. female  PRE-OPERATIVE DIAGNOSIS:  Right Ovarian cyst  POST-OPERATIVE DIAGNOSIS:  Right Ovarian cyst  PROCEDURE:  Procedure(s): LAPAROSCOPIC OVARIAN CYSTECTOMY (Right)  SURGEON:  Surgeon(s) and Role:    Jonnie Kind, MD - Primary  PHYSICIAN ASSISTANT:   ASSISTANTS: Zoila Shutter, CST  ANESTHESIA:   local and general  EBL:  100 mL   BLOOD ADMINISTERED:none  DRAINS: none   LOCAL MEDICATIONS USED:  MARCAINE with epinephrine and Amount: 19 ml  SPECIMEN:  Source of Specimen:  Right ovarian cyst and cyst fluid  DISPOSITION OF SPECIMEN:  PATHOLOGY  COUNTS:  YES  TOURNIQUET:  * No tourniquets in log *  DICTATION: .Dragon Dictation  PLAN OF CARE: Discharge to home after PACU  PATIENT DISPOSITION:  PACU - hemodynamically stable.   Delay start of Pharmacological VTE agent (>24hrs) due to surgical blood loss or risk of bleeding: not applicable Details of procedure: Patient was taken the operating room prepped and draped for combined abdominal and vaginal procedure and timeout conducted by surgical team.  Ancef is was administered.  X2 g.  An infraumbilical vertical 1 cm skin incision was made and suprapubic 1 cm skin incision and right lower quadrant incision of similar length.  Veress needle was used through the umbilical port while elevating the abdominal wall and pulling the abdomen toward the pelvis.  Water droplet technique was used to confirm easy flow into the abdominal cavity.  Pneumoperitoneum with 8 mmHg pressure to 3 L CO2 was performed and then the 11 mm camera placed through the umbilical trocar site.  The abdomen was visibly normal with no evidence of bleeding or suspicion of trauma associated with trocar entry or insufflation.  Attention was directed to the suprapubic and right lower quadrant trochars.  11 mm trocar was placed suprapubically and a 5 mm trocar on the right lower quadrant under direct visualization.   Attention was then directed to the pelvis.  Photo to shows the cyst in its initial appearance which is rotated to the pelvic brim with a simple smooth bilobed cyst.  Photo 3 shows it after the bowel is been moved out of the way with a normal-appearing fallopian tube.  The uterus was then elevated and the left adnexa photographed and shows a essentially normal-appearing ovary.  And photo 5 the left ovary shows a stigma of ovulation on its distal tip.  Photos 6 7 and a show smooth omentum with no abnormalities.  The cyst was then aspirated with an 18-gauge needle aspirating removing approximately 55 cc of fluid from the cyst pocket decompressing it virtually completely.  Cyst fluid was sent as a specimen.  The distal portion of the ovary just adjacent to where it appeared that the cyst predominated was then opened with scissors through the surface of the cortex.  Using blunt dissection we were able to identify the cyst wall.  Kitner dissector, and blunt grasper was then used to dissect along the cleavage plane outside the cyst, and using traction and countertraction in a serial fashion rotating around we were able to gradually mobilize the cyst from the adjacent cortical tissue and followed a normal easy relatively easy dissection except for a small area at the distalmost portion of the cyst which remained densely adherent to the ovarian tissue.  This was elevated up and harmonic a 7 used to amputate this adherent area with the ovarian tissue attached the ovary bled lightly during the dissection  but had spontaneous hemostasis. Photo 12 documents the ovary immediately upon completion of the excision of the attached area of the cyst.  The cortical portion of the ovary appears essentially intact.  The pelvis was copiously irrigated was as well as the lower abdomen using a total of 1300 cc of saline to irrigate all the surfaces and move the bowel around.  Cyst was then placed in Endo Catch bag and then extracted and  sent for pathology.  This was inspected and confirmed as hemostasis. Abdomen was deflated, S retractors used and to find the fascia at the suprapubic and umbilical site and these were grasped individually with Allis clamp, which was then elevated and a 0 Vicryl closure of the fascial defect performed under direct visualization.  Subcutaneous tissues were then reapproximated with subcuticular 4-0 Vicryl and Steri-Strips applied.  Sponge and needle counts were correct with wand confirmation of sponge count performed Sponge and needle counts correct`

## 2019-03-30 NOTE — Op Note (Signed)
03/30/2019  1:37 PM  PATIENT:  Bianca Singleton  23 y.o. female  PRE-OPERATIVE DIAGNOSIS:  Right Ovarian cyst  POST-OPERATIVE DIAGNOSIS:  Right Ovarian cyst  PROCEDURE:  Procedure(s): LAPAROSCOPIC OVARIAN CYSTECTOMY (Right)  SURGEON:  Surgeon(s) and Role:    Jonnie Kind, MD - Primary  PHYSICIAN ASSISTANT:   ASSISTANTS: Zoila Shutter, CST  ANESTHESIA:   local and general  EBL:  100 mL   BLOOD ADMINISTERED:none  DRAINS: none   LOCAL MEDICATIONS USED:  MARCAINE with epinephrine and Amount: 19 ml  SPECIMEN:  Source of Specimen:  Right ovarian cyst and cyst fluid  DISPOSITION OF SPECIMEN:  PATHOLOGY  COUNTS:  YES  TOURNIQUET:  * No tourniquets in log *  DICTATION: .Dragon Dictation  PLAN OF CARE: Discharge to home after PACU  PATIENT DISPOSITION:  PACU - hemodynamically stable.   Delay start of Pharmacological VTE agent (>24hrs) due to surgical blood loss or risk of bleeding: not applicable Details of procedure: Patient was taken the operating room prepped and draped for combined abdominal and vaginal procedure and timeout conducted by surgical team.  Ancef is was administered.  X2 g.  An infraumbilical vertical 1 cm skin incision was made and suprapubic 1 cm skin incision and right lower quadrant incision of similar length.  Veress needle was used through the umbilical port while elevating the abdominal wall and pulling the abdomen toward the pelvis.  Water droplet technique was used to confirm easy flow into the abdominal cavity.  Pneumoperitoneum with 8 mmHg pressure to 3 L CO2 was performed and then the 11 mm camera placed through the umbilical trocar site.  The abdomen was visibly normal with no evidence of bleeding or suspicion of trauma associated with trocar entry or insufflation.  Attention was directed to the suprapubic and right lower quadrant trochars.  11 mm trocar was placed suprapubically and a 5 mm trocar on the right lower quadrant under direct visualization.   Attention was then directed to the pelvis.  Photo to shows the cyst in its initial appearance which is rotated to the pelvic brim with a simple smooth bilobed cyst.  Photo 3 shows it after the bowel is been moved out of the way with a normal-appearing fallopian tube.  The uterus was then elevated and the left adnexa photographed and shows a essentially normal-appearing ovary.  And photo 5 the left ovary shows a stigma of ovulation on its distal tip.  Photos 6 7 and a show smooth omentum with no abnormalities.  The cyst was then aspirated with an 18-gauge needle aspirating removing approximately 55 cc of fluid from the cyst pocket decompressing it virtually completely.  Cyst fluid was sent as a specimen.  The distal portion of the ovary just adjacent to where it appeared that the cyst predominated was then opened with scissors through the surface of the  Ovarian cortex.  Using blunt dissection we were able to identify the cyst wall.  Kitner dissector, and blunt grasper was then used to dissect along the cleavage plane outside the cyst, and using traction and countertraction in a serial fashion rotating around we were able to gradually mobilize the cyst from the adjacent cortical tissue and followed a  relatively easy dissection except for a small area at the distalmost portion of the cyst which remained densely adherent to the ovarian tissue.  This was elevated up and harmonic a 7 used to amputate this adherent area with the ovarian tissue attached the ovary bled lightly during the  dissection but had spontaneous hemostasis. Photo 12 documents the ovary immediately upon completion of the excision of the attached area of the cyst.  The cortical portion of the ovary appears essentially intact.  The pelvis was copiously irrigated was as well as the lower abdomen using a total of 1300 cc of saline to irrigate all the surfaces and move the bowel around.  Cyst was then placed in Endo Catch bag and then extracted and sent  for pathology.  This was inspected and confirmed as hemostasis. Abdomen was deflated, S retractors used and to find the fascia at the suprapubic and umbilical site and these were grasped individually with Allis clamp, which was then elevated and a 0 Vicryl closure of the fascial defect performed under direct visualization.  Subcutaneous tissues were then reapproximated with subcuticular 4-0 Vicryl and Steri-Strips applied.  Sponge and needle counts were correct with wand confirmation of sponge count performed Sponge and needle counts correct`

## 2019-03-30 NOTE — Anesthesia Postprocedure Evaluation (Signed)
Anesthesia Post Note  Patient: Bianca Singleton  Procedure(s) Performed: LAPAROSCOPIC OVARIAN CYSTECTOMY (Right )  Patient location during evaluation: PACU Anesthesia Type: General Level of consciousness: awake Pain management: pain level controlled Vital Signs Assessment: post-procedure vital signs reviewed and stable Respiratory status: spontaneous breathing Cardiovascular status: stable Postop Assessment: no apparent nausea or vomiting Anesthetic complications: no     Last Vitals:  Vitals:   03/30/19 1426 03/30/19 1430  BP: (!) 94/57 (!) 101/52  Pulse: 85 71  Resp: 18 17  Temp:    SpO2: 95% 94%    Last Pain:  Vitals:   03/30/19 1410  TempSrc:   PainSc: 8                  Jameca Chumley

## 2019-03-31 ENCOUNTER — Telehealth: Payer: Self-pay | Admitting: *Deleted

## 2019-03-31 ENCOUNTER — Encounter (HOSPITAL_COMMUNITY): Payer: Self-pay | Admitting: Obstetrics and Gynecology

## 2019-03-31 LAB — SURGICAL PATHOLOGY

## 2019-03-31 NOTE — Telephone Encounter (Signed)
Call unable to be completed. Will try again later.

## 2019-03-31 NOTE — Telephone Encounter (Signed)
Call still unable to be completed.  Multiple attempts made.

## 2019-03-31 NOTE — Telephone Encounter (Signed)
Pt left message that Dr. Glo Herring did surgery on her yesterday and the pain med is not helping at all.

## 2019-04-01 NOTE — Discharge Summary (Signed)
Physician Discharge Summary  Patient ID: Bianca Singleton MRN: [id number] DOB/AGE: [birth year] 23 y.o.  Admit date: 03/30/2019 Discharge date: 11/10  Admission Diagnoses:right ovarian cyst  Discharge Diagnoses:  Active Problems:   Ovarian cyst, right   Discharged Condition: good  Hospital Course: patient underwent right ovarian cystectomy as described in other OP NOTE. And we preserved the remainder of the right ovary.  Consults: None  Significant Diagnostic Studies: labs: Pathology:Benign ovarian cystadenofibroma,  Treatments: surgery: laparoscopic right ovarian cystectomy  Discharge Exam: Blood pressure 110/60, pulse 71, temperature 97.8 F (36.6 C), resp. rate 18, SpO2 99 %. General appearance: alert, cooperative and moderately obese GI: soft, non-tender; bowel sounds normal; no masses,  no organomegaly Extremities: extremities normal, atraumatic, no cyanosis or edema and Homans sign is negative, no sign of DVT  Disposition:   Discharge Instructions    Call MD for:  persistant nausea and vomiting   Complete by: As directed    Call MD for:  severe uncontrolled pain   Complete by: As directed    Call MD for:  temperature >100.4   Complete by: As directed    Diet - low sodium heart healthy   Complete by: As directed    Increase activity slowly   Complete by: As directed      Allergies as of 03/30/2019   No Known Allergies     Medication List    TAKE these medications   acetaminophen-codeine 300-30 MG tablet Commonly known as: TYLENOL #3 Take 1-2 tablets by mouth every 4 (four) hours as needed for moderate pain.   escitalopram 10 MG tablet Commonly known as: LEXAPRO Take 10 mg by mouth daily.   HYDROcodone-acetaminophen 5-325 MG tablet Commonly known as: NORCO/VICODIN Take 1 tablet by mouth every 6 (six) hours as needed for moderate pain. May take with ibuprofen   hydrOXYzine 50 MG tablet Commonly known as: ATARAX/VISTARIL Take 50 mg by mouth every 6 (six)  hours as needed.   levETIRAcetam 500 MG tablet Commonly known as: KEPPRA Take 500 mg by mouth 2 (two) times daily.   norgestimate-ethinyl estradiol 0.25-35 MG-MCG tablet Commonly known as: ORTHO-CYCLEN Take 1 tablet by mouth daily.   pramipexole 0.5 MG tablet Commonly known as: MIRAPEX Take 0.5 mg by mouth 3 (three) times daily.   promethazine 25 MG tablet Commonly known as: PHENERGAN Take 1 tablet (25 mg total) by mouth every 6 (six) hours as needed for nausea or vomiting.   traZODone 50 MG tablet Commonly known as: DESYREL 25 mg at bedtime.      Follow-up Information    Follow up In 1 week.   Why: Postoperative visit          Signed: Jonnie Kind 04/01/2019, 6:12 PM

## 2019-04-02 MED ORDER — ACETAMINOPHEN-CODEINE #3 300-30 MG PO TABS: 2.0000 | ORAL_TABLET | Freq: Four times a day (QID) | ORAL | 0 refills | Status: AC | PRN

## 2019-04-06 ENCOUNTER — Encounter: Payer: Self-pay | Admitting: Obstetrics and Gynecology

## 2019-04-06 ENCOUNTER — Ambulatory Visit (INDEPENDENT_AMBULATORY_CARE_PROVIDER_SITE_OTHER): Payer: Medicaid Other | Admitting: Obstetrics and Gynecology

## 2019-04-06 ENCOUNTER — Other Ambulatory Visit: Payer: Self-pay

## 2019-04-06 VITALS — BP 112/71 | HR 72 | Wt 204.0 lb

## 2019-04-06 DIAGNOSIS — Z9889 Other specified postprocedural states: Secondary | ICD-10-CM

## 2019-04-06 DIAGNOSIS — Z09 Encounter for follow-up examination after completed treatment for conditions other than malignant neoplasm: Secondary | ICD-10-CM

## 2019-04-06 NOTE — Progress Notes (Signed)
   Subjective:  Bianca Singleton is a 23 y.o. female now 1 weeks status post right ovarian cystectomy.    She has mild right adnexal pain,  Review of Systems Negative    Diet:   regular   Bowel movements : normal.  Pain is controlled with current analgesics. Medications being used: narcotic analgesics including hydrocodone..  Objective:  BP 112/71 (BP Location: Right Arm, Patient Position: Sitting, Cuff Size: Normal)   Pulse 72   Wt 204 lb (92.5 kg)   LMP 03/22/2019   BMI 35.02 kg/m  General:Well developed, well nourished.  No acute distress. Abdomen: Bowel sounds normal, soft, non-tender. Pelvic Exam:not done     Incision(s): healing well no redness     Assessment:  Post-Op 1 weeks s/p right ovarian cystectomy   stable postoperatively.   Plan:  1.Wound care discussed   2. . current medications.vicodin 3. Activity restrictions: none 4. return to work: not applicable. 5. Follow up in 4 weeks.

## 2019-04-19 ENCOUNTER — Other Ambulatory Visit: Payer: Self-pay | Admitting: Obstetrics and Gynecology

## 2019-04-19 MED ORDER — ACETAMINOPHEN-CODEINE #3 300-30 MG PO TABS: 1.0000 | ORAL_TABLET | Freq: Four times a day (QID) | ORAL | 0 refills | Status: AC | PRN

## 2019-04-19 NOTE — Progress Notes (Signed)
refil of the tylenol # 3 x 30 tabs, to be taken sparingly for residual LLQ postop pain after recent ovarian cystectomy.

## 2019-06-08 NOTE — Telephone Encounter (Signed)
Please set up an ultrasound at the office.

## 2019-06-10 ENCOUNTER — Other Ambulatory Visit: Payer: Self-pay | Admitting: Obstetrics and Gynecology

## 2019-06-10 DIAGNOSIS — N83201 Unspecified ovarian cyst, right side: Secondary | ICD-10-CM

## 2019-06-14 ENCOUNTER — Other Ambulatory Visit: Payer: Medicaid Other

## 2019-07-29 ENCOUNTER — Other Ambulatory Visit: Payer: Medicaid Other

## 2019-07-29 ENCOUNTER — Other Ambulatory Visit: Payer: Self-pay

## 2019-07-29 ENCOUNTER — Other Ambulatory Visit: Payer: Self-pay | Admitting: Obstetrics and Gynecology

## 2019-07-29 ENCOUNTER — Ambulatory Visit (INDEPENDENT_AMBULATORY_CARE_PROVIDER_SITE_OTHER): Payer: Medicaid Other

## 2019-07-29 DIAGNOSIS — O3680X0 Pregnancy with inconclusive fetal viability, not applicable or unspecified: Secondary | ICD-10-CM

## 2019-07-29 DIAGNOSIS — N83201 Unspecified ovarian cyst, right side: Secondary | ICD-10-CM

## 2019-07-29 NOTE — Progress Notes (Signed)
PELVIC US TA/TV: homogeneous anteverted uterus,wnl,EEC 12.4 mm,normal ovaries,ovaries appear mobile,no fee fluid,no pain during ultrasound

## 2019-07-30 LAB — BETA HCG QUANT (REF LAB): hCG Quant: <1 m[IU]/mL

## 2021-01-19 IMAGING — DX PORTABLE CHEST - 1 VIEW
1 series · 1 of 1 positions shown · non-contrast
Comparison: None.

CLINICAL DATA: Strep throat

EXAM:
PORTABLE CHEST 1 VIEW

[chest ap]
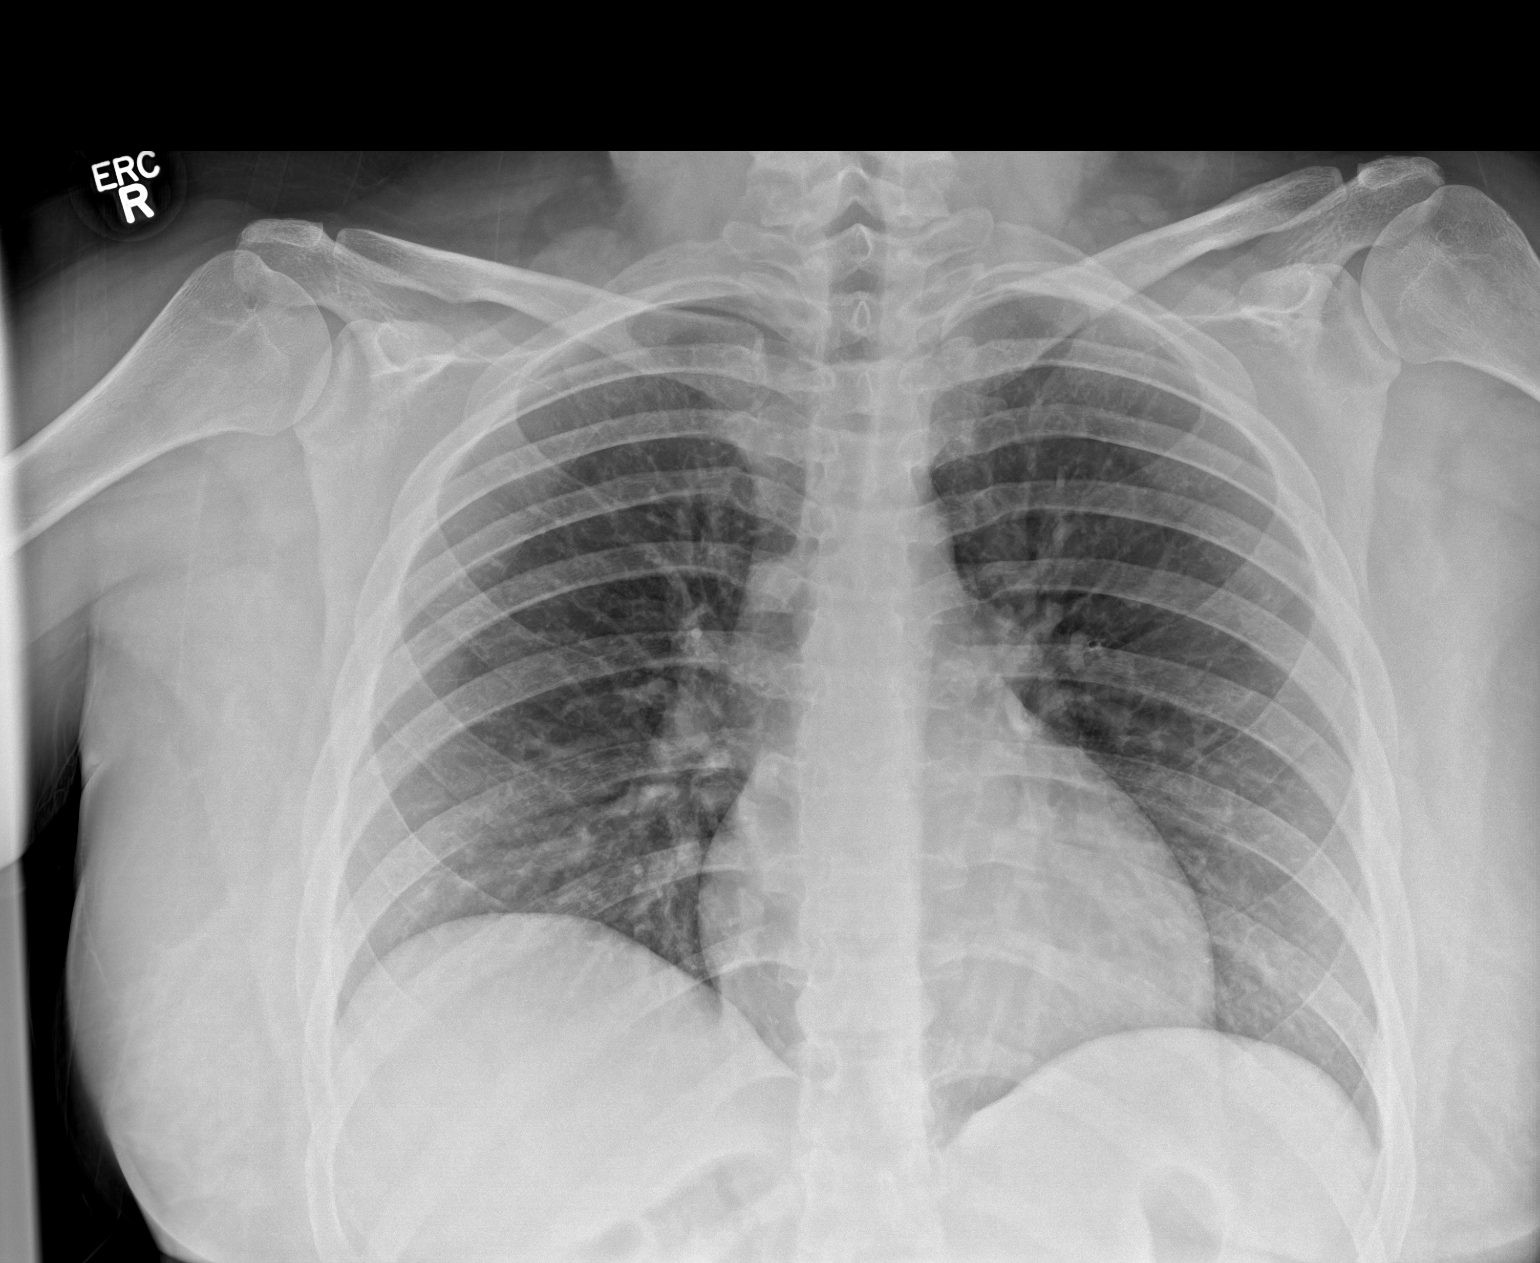

[1 of 1 positions shown; findings below may reference images not displayed]

FINDINGS: Lungs are clear.  No pleural effusion or pneumothorax.

The heart is normal in size.
IMPRESSION: Normal chest radiographs.
